# Patient Record
Sex: Male | Born: 1935 | Race: White | Hispanic: No | Smoking: Former smoker
Health system: Southern US, Community
[De-identification: ages and names within clinical notes are randomized; demographics above are authoritative.]

## PROBLEM LIST (undated history)

## (undated) DIAGNOSIS — E079 Disorder of thyroid, unspecified: Secondary | ICD-10-CM

## (undated) DIAGNOSIS — I714 Abdominal aortic aneurysm, without rupture, unspecified: Secondary | ICD-10-CM

## (undated) DIAGNOSIS — M1990 Unspecified osteoarthritis, unspecified site: Secondary | ICD-10-CM

## (undated) DIAGNOSIS — R600 Localized edema: Secondary | ICD-10-CM

## (undated) DIAGNOSIS — R161 Splenomegaly, not elsewhere classified: Secondary | ICD-10-CM

## (undated) DIAGNOSIS — R351 Nocturia: Secondary | ICD-10-CM

## (undated) DIAGNOSIS — D649 Anemia, unspecified: Secondary | ICD-10-CM

## (undated) DIAGNOSIS — Z87898 Personal history of other specified conditions: Secondary | ICD-10-CM

## (undated) DIAGNOSIS — R160 Hepatomegaly, not elsewhere classified: Secondary | ICD-10-CM

## (undated) DIAGNOSIS — I10 Essential (primary) hypertension: Secondary | ICD-10-CM

## (undated) DIAGNOSIS — T7840XA Allergy, unspecified, initial encounter: Secondary | ICD-10-CM

## (undated) HISTORY — DX: Localized edema: R60.0

## (undated) HISTORY — DX: Personal history of other specified conditions: Z87.898

## (undated) HISTORY — DX: Allergy, unspecified, initial encounter: T78.40XA

## (undated) HISTORY — DX: Abdominal aortic aneurysm, without rupture: I71.4

## (undated) HISTORY — DX: Splenomegaly, not elsewhere classified: R16.1

## (undated) HISTORY — DX: Disorder of thyroid, unspecified: E07.9

## (undated) HISTORY — DX: Abdominal aortic aneurysm, without rupture, unspecified: I71.40

## (undated) HISTORY — DX: Nocturia: R35.1

## (undated) HISTORY — DX: Anemia, unspecified: D64.9

## (undated) HISTORY — DX: Hepatomegaly, not elsewhere classified: R16.0

## (undated) HISTORY — DX: Unspecified osteoarthritis, unspecified site: M19.90

## (undated) HISTORY — PX: SHOULDER SURGERY: SHX246

---

## 1998-10-18 HISTORY — PX: HERNIA REPAIR: SHX51

## 2002-10-18 ENCOUNTER — Encounter: Payer: Self-pay | Admitting: *Deleted

## 2002-10-18 ENCOUNTER — Emergency Department (HOSPITAL_COMMUNITY): Admission: EM | Admit: 2002-10-18 | Discharge: 2002-10-18 | Payer: Self-pay | Admitting: *Deleted

## 2004-06-18 ENCOUNTER — Ambulatory Visit (HOSPITAL_COMMUNITY): Admission: RE | Admit: 2004-06-18 | Discharge: 2004-06-18 | Payer: Self-pay | Admitting: Family Medicine

## 2004-06-18 ENCOUNTER — Observation Stay (HOSPITAL_COMMUNITY): Admission: AD | Admit: 2004-06-18 | Discharge: 2004-06-19 | Payer: Self-pay | Admitting: Internal Medicine

## 2005-02-23 ENCOUNTER — Emergency Department (HOSPITAL_COMMUNITY): Admission: EM | Admit: 2005-02-23 | Discharge: 2005-02-23 | Payer: Self-pay | Admitting: Emergency Medicine

## 2005-10-30 ENCOUNTER — Emergency Department (HOSPITAL_COMMUNITY): Admission: EM | Admit: 2005-10-30 | Discharge: 2005-10-30 | Payer: Self-pay | Admitting: Emergency Medicine

## 2006-03-16 ENCOUNTER — Emergency Department (HOSPITAL_COMMUNITY): Admission: EM | Admit: 2006-03-16 | Discharge: 2006-03-16 | Payer: Self-pay | Admitting: Emergency Medicine

## 2008-06-05 ENCOUNTER — Ambulatory Visit (HOSPITAL_COMMUNITY): Admission: RE | Admit: 2008-06-05 | Discharge: 2008-06-05 | Payer: Self-pay | Admitting: Family Medicine

## 2008-09-06 ENCOUNTER — Encounter (HOSPITAL_COMMUNITY): Admission: RE | Admit: 2008-09-06 | Discharge: 2008-10-06 | Payer: Self-pay | Admitting: Family Medicine

## 2008-10-18 HISTORY — PX: FRACTURE SURGERY: SHX138

## 2008-11-30 ENCOUNTER — Inpatient Hospital Stay (HOSPITAL_COMMUNITY): Admission: EM | Admit: 2008-11-30 | Discharge: 2008-12-02 | Payer: Self-pay | Admitting: Emergency Medicine

## 2009-04-09 ENCOUNTER — Encounter: Admission: RE | Admit: 2009-04-09 | Discharge: 2009-07-08 | Payer: Self-pay | Admitting: Orthopedic Surgery

## 2011-02-02 LAB — CBC
Hemoglobin: 13.3 g/dL (ref 13.0–17.0)
Hemoglobin: 12.5 g/dL — ABNORMAL LOW (ref 13.0–17.0)
MCHC: 34 g/dL (ref 30.0–36.0)
MCHC: 34.3 g/dL (ref 30.0–36.0)
MCV: 90.7 fL (ref 78.0–100.0)
MCV: 92 fL (ref 78.0–100.0)
RBC: 4.32 MIL/uL (ref 4.22–5.81)
RDW: 14.1 % (ref 11.5–15.5)
WBC: 9.1 10*3/uL (ref 4.0–10.5)

## 2011-02-02 LAB — BASIC METABOLIC PANEL
BUN: 13 mg/dL (ref 6–23)
BUN: 9 mg/dL (ref 6–23)
CO2: 28 mEq/L (ref 19–32)
CO2: 29 mEq/L (ref 19–32)
Calcium: 9.4 mg/dL (ref 8.4–10.5)
Calcium: 8.5 mg/dL (ref 8.4–10.5)
Calcium: 8.8 mg/dL (ref 8.4–10.5)
Creatinine, Ser: 0.85 mg/dL (ref 0.4–1.5)
Creatinine, Ser: 0.89 mg/dL (ref 0.4–1.5)
GFR calc non Af Amer: >60 mL/min (ref >60)
GFR calc non Af Amer: >60 mL/min (ref >60)
Glucose, Bld: 126 mg/dL — ABNORMAL HIGH (ref 70–99)
Glucose, Bld: 119 mg/dL — ABNORMAL HIGH (ref 70–99)
Glucose, Bld: 137 mg/dL — ABNORMAL HIGH (ref 70–99)
Sodium: 142 mEq/L (ref 135–145)
Sodium: 142 mEq/L (ref 135–145)

## 2011-02-02 LAB — DIFFERENTIAL
Basophils Relative: 0 % (ref 0–1)
Eosinophils Absolute: 0.1 10*3/uL (ref 0.0–0.7)
Lymphs Abs: 1.1 10*3/uL (ref 0.7–4.0)
Monocytes Absolute: 0.6 10*3/uL (ref 0.1–1.0)
Monocytes Relative: 6 % (ref 3–12)
Neutro Abs: 7.3 10*3/uL (ref 1.7–7.7)
Neutrophils Relative %: 80 % — ABNORMAL HIGH (ref 43–77)

## 2011-02-02 LAB — GLUCOSE, CAPILLARY
Glucose-Capillary: 121 mg/dL — ABNORMAL HIGH (ref 70–99)
Glucose-Capillary: 131 mg/dL — ABNORMAL HIGH (ref 70–99)
Glucose-Capillary: 150 mg/dL — ABNORMAL HIGH (ref 70–99)
Glucose-Capillary: 184 mg/dL — ABNORMAL HIGH (ref 70–99)
Glucose-Capillary: 85 mg/dL (ref 70–99)

## 2011-02-02 LAB — SAMPLE TO BLOOD BANK

## 2011-02-02 LAB — PROTIME-INR: INR: 1 (ref 0.00–1.49)

## 2011-02-06 ENCOUNTER — Emergency Department (HOSPITAL_COMMUNITY)
Admission: EM | Admit: 2011-02-06 | Discharge: 2011-02-07 | Disposition: A | Discharge status: Home / Self Care | Payer: Medicare Other | Attending: Emergency Medicine | Admitting: Emergency Medicine

## 2011-02-06 DIAGNOSIS — Z7982 Long term (current) use of aspirin: Secondary | ICD-10-CM | POA: Insufficient documentation

## 2011-02-06 DIAGNOSIS — I10 Essential (primary) hypertension: Secondary | ICD-10-CM | POA: Insufficient documentation

## 2011-02-06 DIAGNOSIS — R040 Epistaxis: Secondary | ICD-10-CM | POA: Insufficient documentation

## 2011-02-06 DIAGNOSIS — Z86711 Personal history of pulmonary embolism: Secondary | ICD-10-CM | POA: Insufficient documentation

## 2011-02-06 DIAGNOSIS — Z79899 Other long term (current) drug therapy: Secondary | ICD-10-CM | POA: Insufficient documentation

## 2011-02-06 DIAGNOSIS — E785 Hyperlipidemia, unspecified: Secondary | ICD-10-CM | POA: Insufficient documentation

## 2011-03-02 NOTE — Op Note (Signed)
NAMEERRIC, Adrian Hester                 ACCOUNT NO.:  [id number]   MEDICAL RECORD NO.:  [id number]          PATIENT TYPE:  INP   LOCATION:  5016                         FACILITY:  [facility]   PHYSICIAN:  Mila Homer. Sherlean Foot, M.D. DATE OF BIRTH:  November 06, 1929   DATE OF PROCEDURE:  11/30/2008  DATE OF DISCHARGE:                               OPERATIVE REPORT   SURGEON:  Mila Homer. Sherlean Foot, M.D.   ASSISTANT:  Altamese Cabal, PA-C   ANESTHESIA:  General.   PREOPERATIVE DIAGNOSIS:  Left leg trimalleolar ankle fracture.   POST OPERATIVE DIAGNOSIS:  Left leg trimalleolar ankle fracture.   PROCEDURE:  Left ankle open reduction and internal fixation.   INDICATION FOR PROCEDURE:  The patient is a 77 year old white male after  falling this morning while on a walk, came to the emergency room.  Informed consent was obtained from the patient.   DESCRIPTION OF PROCEDURE:  The patient was laid supine, administered  general anesthesia.  Left leg was prepped and draped in usual sterile  fashion.  A lateral incision was made and direct lateral approach  performed down to the lateral malleolus.  The fracture was cleaned out  and then was reduced with bone tenaculum and reduction clamp.  I then  placed a lag screw in the standard fashion anterior to posterior.  I  then again checked it with C-arm to ensure it was reduced anatomically,  held very nicely after removing the clamp.  I then placed a  neutralization plate, 7 holes semitubular plate and placed 2 distal  cancellous screws, 3 proximal and distal interlocking screws.  Took a  picture to make sure all screws were appropriate and the plate was one  anatomically.  Then, I made a medial incision about 3-cm in length with  #10 blade in the medial malleolus, bluntly dissected down to the fascia,  now the fascia reduced to the two typical 9 mm K-wires.  Took C-arm  images to ensure the borders are anatomic.  Then, placed a 35-mm  prosthetic cancellous screw, 4 mm  in diameter anteriorly and 30 mm long  in the posterior one and took x-rays to ensure appropriate screw length  and anatomic reduction of the borders.  I then irrigated and closed with  0 and 2-0 Vicryl sutures, and skin staples.   COMPLICATIONS:  None.   DRAINS:  None.   TOURNIQUET TIME:  53 minutes.          ______________________________  Mila Homer Sherlean Foot, M.D.    SDL/MEDQ  D:  11/30/2008  T:  12/01/2008  Job:  [id number]

## 2011-03-05 NOTE — Discharge Summary (Signed)
NAMECASEY, Adrian Hester                 ACCOUNT NO.:  [id number]   MEDICAL RECORD NO.:  [id number]          PATIENT TYPE:  INP   LOCATION:  5016                         FACILITY:  [facility]   PHYSICIAN:  Mila Homer. Sherlean Foot, M.D. DATE OF BIRTH:  1930/07/12   DATE OF ADMISSION:  11/30/2008  DATE OF DISCHARGE:  12/02/2008                               DISCHARGE SUMMARY   ADMISSION DIAGNOSIS:  Left ankle fracture.   DISCHARGE DIAGNOSES:  1. Left ankle fracture, status post open reduction and internal      fixation of the left ankle.  2. Acute blood loss anemia, status post surgery.   PROCEDURE:  Open reduction and internal fixation of the left ankle.   HISTORY:  The patient is a 77 year old male who slipped on the ice the  morning of surgery, brought from [facility] via [facility] transfer.  No  other complaints.   PAST MEDICAL HISTORY:  Significant for increased cholesterol and  diabetes.  The patient's x-ray showed a left ankle fracture which was  intact with splint from [facility].   ALLERGIES:  No known drug allergies.   ADMISSION MEDICATIONS:  1. Aspirin 81 mg daily.  2. Metformin 500 mg twice a day.  3. Simcor 500/20 mg at bedtime.  4. Lotrel 10/20 mg daily.   HOSPITAL COURSE:  This is a 77 year old male admitted on November 30, 2008, after appropriate laboratory studies were obtained preoperatively  as well as Ancef on-call to the operating room, he was taken to the OR  where he underwent a left ankle ORIF.  The patient tolerated the  procedure well and was taken to the PACU in good condition.  The patient  was placed on p.o. pain medication.  No Foley was placed.   On postop day 1, the patient's pain was 1/10.  The patient's vital signs  were stable, within normal limits.  The patient was alert and oriented  x3.  Wound was clean and intact.  Compartment was soft and nontender.  Cardiovascular, regular rate and rhythm.  Lungs are clear to  auscultation bilaterally.  Abdomen was  soft, positive bowel sounds.  Lower extremity was intact.  The patient was out of bed with physical  therapy.  On postop day 2, the patient continued to progress with  physical therapy.  The patient was in splint, alert and oriented.  Vital  signs remained stable.  He had a negative Homans.  The rest of exam  remained stable as well.  The patient was continued on p.o. pain  medications and discharged to home after Lovenox teaching.   LABORATORY STUDIES:  Upon discharge from the hospital, the patient's  white blood cell count was 9.0, H and H was 12.5 and 36.5, and platelets  of 169.  Sodium was 139, potassium was 3.4, chloride was 105, CO2 of 28,  glucose of 137, BUN was 8, and creatinine was 0.85.   DISCHARGE INSTRUCTIONS:  There are no restrictions to diet.  The patient  is to follow his regular diabetic diet and continue taking his  medications except for aspirin which he  should discontinue until Lovenox  has finished.  Follow the blue instruction sheet for wound care.  Increase activity slowly.  May use a cane or walker.  Weightbearing as  tolerated.  No lifting or driving for 6 weeks.   DISCHARGE MEDICATIONS:  Upon discharge, the patient was given  prescriptions for;  1. Lovenox 40 mg inject 1 daily subcutaneously, last dose on December 12, 2008.  2. Vicodin 5/500 one to two tabs every 4-6 hours as needed for pain.  3. Celebrex 200 mg twice a day x30.  4. Also, KCl 10 mEq 1 twice a day for a week, #14.   The patient will follow up with Dr. Sherlean Foot on December 05, 2008, call for  appointment, [phone].  The patient is discharged in improved condition.     ______________________________  Adrian Cabal, PA-C    ______________________________  Mila Homer. Sherlean Foot, M.D.    MJ/MEDQ  D:  01/03/2009  T:  01/04/2009  Job:  [id number]

## 2012-03-13 ENCOUNTER — Emergency Department (HOSPITAL_COMMUNITY)
Admission: EM | Admit: 2012-03-13 | Discharge: 2012-03-13 | Disposition: A | Discharge status: Home / Self Care | Payer: Medicare Other | Attending: Emergency Medicine | Admitting: Emergency Medicine

## 2012-03-13 ENCOUNTER — Encounter (HOSPITAL_COMMUNITY): Payer: Self-pay | Admitting: Emergency Medicine

## 2012-03-13 DIAGNOSIS — R040 Epistaxis: Secondary | ICD-10-CM | POA: Insufficient documentation

## 2012-03-13 DIAGNOSIS — I10 Essential (primary) hypertension: Secondary | ICD-10-CM | POA: Insufficient documentation

## 2012-03-13 DIAGNOSIS — Z7982 Long term (current) use of aspirin: Secondary | ICD-10-CM | POA: Insufficient documentation

## 2012-03-13 HISTORY — DX: Essential (primary) hypertension: I10

## 2012-03-13 LAB — DIFFERENTIAL
Basophils Relative: 1 % (ref 0–1)
Eosinophils Absolute: 0.2 10*3/uL (ref 0.0–0.7)
Eosinophils Relative: 2 % (ref 0–5)
Monocytes Relative: 8 % (ref 3–12)
Neutrophils Relative %: 70 % (ref 43–77)

## 2012-03-13 LAB — CBC
Hemoglobin: 12.7 g/dL — ABNORMAL LOW (ref 13.0–17.0)
MCH: 29.4 pg (ref 26.0–34.0)
MCHC: 33 g/dL (ref 30.0–36.0)
MCV: 89.1 fL (ref 78.0–100.0)

## 2012-03-13 LAB — PROTIME-INR: INR: 0.99 (ref 0.00–1.49)

## 2012-03-13 MED ORDER — PHENYLEPHRINE HCL 0.5 % NA SOLN
1.0000 [drp] | Freq: Once | NASAL | Status: AC
  Administered 2012-03-13: 1 [drp] via NASAL
  Filled 2012-03-13: qty 15

## 2012-03-13 MED ORDER — SILVER NITRATE-POT NITRATE 75-25 % EX MISC
1.0000 "application " | Freq: Once | CUTANEOUS | Status: AC
  Administered 2012-03-13: 1 via TOPICAL
  Filled 2012-03-13: qty 1

## 2012-03-13 NOTE — ED Provider Notes (Signed)
History  This chart was scribed for Glynn Octave, MD by Sanjuana Letters Ajibulu. This patient was seen in room APA01/APA01 and the patient's care was started at 7:40AM.    CSN: [id number]  Arrival date & time 03/13/12  1478   First MD Initiated Contact with Patient 03/13/12 [phone]      Chief Complaint  Patient presents with  . Epistaxis    (Consider location/radiation/quality/duration/timing/severity/associated sxs/prior treatment) Patient is a 78 y.o. male presenting with nosebleeds. The history is provided by the patient. No language interpreter was used.  Epistaxis    Adrian Hester is a 78 y.o. male who presents to the Emergency Department complaining of gradual onset epistaxis. Pt reports that he has been having nose bleeds every week for the past month and has had 3 this month so far. Pt reports having a new episode of epistaxis today and described it as heavy. Pt states that his current episode lasted approximately 60 minutes. Pt reports that the bleeding comes from his right nostril only. Pt states that he pinched his nose to stop his current episode of epistaxis. Pt states that his PCP stated that  he has 3 blood vessels in his nose are weak and swollen and was told that he has to get them fused. Pt states that he currently takes Asprin but used to use coumadin. Pt denies taking any medication to relieve his current symptoms. Pt denies fever, eye pain, SOB, chest pain, vomiting, dysuria, back pain, rash, HA, adenopathy and agitation as associated symptoms. Pt has a h/o diabetes and HTN. Pt is a former smoker and denies a h/o alcohol use.  Past Medical History  Diagnosis Date  . Diabetes mellitus   . Hypertension     Past Surgical History  Procedure Date  . Hernia repair     No family history on file.  History  Substance Use Topics  . Smoking status: Former Games developer  . Smokeless tobacco: Not on file  . Alcohol Use: No      Review of Systems  HENT: Positive for nosebleeds.    Genitourinary: Negative for dysuria.  All other systems reviewed and are negative.    Allergies  Review of patient's allergies indicates no known allergies.  Home Medications  No current outpatient prescriptions on file.  Triage Vitals: BP 150/80  Pulse 83  Temp(Src) 98.2 F (36.8 C) (Oral)  Resp 18  Ht 5\' 11"  (1.803 m)  Wt 240 lb (108.863 kg)  BMI 33.47 kg/m2  SpO2 97%  Physical Exam  Nursing note and vitals reviewed. Constitutional: He is oriented to person, place, and time. He appears well-developed and well-nourished.  HENT:  Head: Normocephalic and atraumatic.       Pt has dried blood in the right nostril and has no active bleeding. Pt does not have blood in the oropharynx.  Eyes: Conjunctivae and EOM are normal.  Neck: Normal range of motion. Neck supple.  Cardiovascular: Normal rate, regular rhythm and normal heart sounds.   Pulmonary/Chest: Effort normal and breath sounds normal.  Abdominal: Soft. Bowel sounds are normal.  Musculoskeletal: Normal range of motion.  Neurological: He is alert and oriented to person, place, and time.  Skin: Skin is warm and dry.  Psychiatric: He has a normal mood and affect. His behavior is normal.    ED Course  EPISTAXIS MANAGEMENT Date/Time: 03/13/2012 8:35 AM Performed by: Glynn Octave Authorized by: Glynn Octave Consent: Verbal consent obtained. Risks and benefits: risks, benefits and alternatives were discussed Consent given  by: patient Patient understanding: patient states understanding of the procedure being performed Patient identity confirmed: verbally with patient Time out: Immediately prior to procedure a "time out" was called to verify the correct patient, procedure, equipment, support staff and site/side marked as required. Local anesthetic: topical anesthetic Treatment site: right anterior Repair method: silver nitrate Post-procedure assessment: bleeding stopped Treatment complexity: simple Patient  tolerance: Patient tolerated the procedure well with no immediate complications.   (including critical care time) DIAGNOSTIC STUDIES: Oxygen Saturation is 97% on room air, adequate  by my interpretation.    COORDINATION OF CARE:  7:47 AM: Discussed administering a nose spray to shrink the blood vessels down. Also discussed cauterizating  the blood vessels in his nose. I advised the pt that cauterization is a short term fix and that he still needs to see nose doctor.  Labs Reviewed  CBC - Abnormal; Notable for the following:    Hemoglobin 12.7 (*)    HCT 38.5 (*)    Platelets 139 (*)    All other components within normal limits  DIFFERENTIAL  PROTIME-INR   No results found.   No diagnosis found.    MDM  Intermittent epistaxis from the right naris over the past month. One hour bleeding today the patient was able to control at home. Takes aspirin but not Coumadin.  Friable area to right nasal septum was cauterized  Hemoglobin stable. INR normal. Observed for one hour in the ED without additional bleeding.  Referral to [facility].  I personally performed the services described in this documentation, which was scribed in my presence.  The recorded information has been reviewed and considered.    Glynn Octave, MD 03/13/12 [phone]

## 2012-03-13 NOTE — Discharge Instructions (Signed)

## 2012-03-13 NOTE — ED Notes (Signed)
Patient with c/o epistaxis intermittently x 1 month. Patient reports new episode today with what he describes as heavy and lasting approximately 60 minutes. Bleeding controlled at present. Patient reports taking ASA. Has seen primary care MD for this problem.

## 2012-06-18 DIAGNOSIS — R161 Splenomegaly, not elsewhere classified: Secondary | ICD-10-CM

## 2012-06-18 DIAGNOSIS — R160 Hepatomegaly, not elsewhere classified: Secondary | ICD-10-CM

## 2012-06-18 HISTORY — DX: Hepatomegaly, not elsewhere classified: R16.0

## 2012-06-18 HISTORY — DX: Splenomegaly, not elsewhere classified: R16.1

## 2012-07-11 ENCOUNTER — Other Ambulatory Visit (HOSPITAL_COMMUNITY): Payer: Self-pay | Admitting: Family Medicine

## 2012-07-14 ENCOUNTER — Ambulatory Visit (HOSPITAL_COMMUNITY)
Admission: RE | Admit: 2012-07-14 | Discharge: 2012-07-14 | Disposition: A | Discharge status: Home / Self Care | Payer: Medicare Other | Source: Ambulatory Visit | Attending: Family Medicine | Admitting: Family Medicine

## 2012-07-14 ENCOUNTER — Other Ambulatory Visit (HOSPITAL_COMMUNITY): Payer: Self-pay | Admitting: Family Medicine

## 2012-07-14 DIAGNOSIS — Q619 Cystic kidney disease, unspecified: Secondary | ICD-10-CM | POA: Insufficient documentation

## 2012-07-14 DIAGNOSIS — K7689 Other specified diseases of liver: Secondary | ICD-10-CM | POA: Insufficient documentation

## 2012-07-14 DIAGNOSIS — R161 Splenomegaly, not elsewhere classified: Secondary | ICD-10-CM | POA: Insufficient documentation

## 2012-07-14 DIAGNOSIS — I714 Abdominal aortic aneurysm, without rupture, unspecified: Secondary | ICD-10-CM | POA: Insufficient documentation

## 2012-07-14 LAB — POCT I-STAT, CHEM 8
BUN: 15 mg/dL (ref 6–23)
Calcium, Ion: 1.09 mmol/L — ABNORMAL LOW (ref 1.13–1.30)
Chloride: 111 mEq/L (ref 96–112)
Creatinine, Ser: 1 mg/dL (ref 0.50–1.35)
Glucose, Bld: 114 mg/dL — ABNORMAL HIGH (ref 70–99)
TCO2: 20 mmol/L (ref 0–100)

## 2012-07-14 MED ORDER — GADOBENATE DIMEGLUMINE 529 MG/ML IV SOLN
20.0000 mL | Freq: Once | INTRAVENOUS | Status: AC | PRN
  Administered 2012-07-14: 20 mL via INTRAVENOUS

## 2012-07-14 NOTE — Progress Notes (Signed)
Venipuncture performed in left antecubital for Creatnine level

## 2012-12-20 ENCOUNTER — Encounter: Payer: Self-pay | Admitting: Vascular Surgery

## 2012-12-25 ENCOUNTER — Encounter: Payer: Self-pay | Admitting: Vascular Surgery

## 2013-01-01 ENCOUNTER — Encounter: Payer: Self-pay | Admitting: Vascular Surgery

## 2013-01-02 ENCOUNTER — Ambulatory Visit (INDEPENDENT_AMBULATORY_CARE_PROVIDER_SITE_OTHER): Payer: Medicare Other | Admitting: Vascular Surgery

## 2013-01-02 ENCOUNTER — Encounter: Payer: Self-pay | Admitting: Vascular Surgery

## 2013-01-02 VITALS — BP 164/76 | HR 73 | Ht 71.0 in | Wt 235.0 lb

## 2013-01-02 DIAGNOSIS — I714 Abdominal aortic aneurysm, without rupture, unspecified: Secondary | ICD-10-CM

## 2013-01-02 NOTE — Progress Notes (Signed)
Subjective:     Patient ID: Adrian Hester, male   DOB: 07/18/1930, 79 y.o.   MRN: [id number]  HPIthis 79 year old male was referred by Dr. Lynelle Smoke for evaluation of 5.1 cm abdominal aortic aneurysm. Patient had a previous MRI which revealed the aorta to be 4.4 cm in recent [facility] by [facility]aled the aneurysm to have grown to 5.1 cm in maximum diameter. Patient denies any abdominal symptoms but does have chronic back pain. He does have a remote history of pulmonary embolus and was treated with anticoagulants for 6 months but this was many years ago and he is only taking aspirin at this time.  Past Medical History  Diagnosis Date  . Hypertension   . Thyroid disease     Hypothyroidism, Chronic  . Arthritis     Osteoarthritis  . Hx of epistaxis   . Nocturia   . Hepatomegaly Sept. 2013    Fatty Liver  . Splenomegaly Sept. 2013    Benign splenic hemangioma  . AAA (abdominal aortic aneurysm) Sept. 2013  and  Feb. 2014    Infrarenal   . Edema, lower extremity     Left ankle trauma  . Diabetes mellitus     Type II  . Allergy     Rhinitis, Hayfever  . Nocturia     History  Substance Use Topics  . Smoking status: Former Smoker    Quit date: 06/03/2011  . Smokeless tobacco: Never Used  . Alcohol Use: No    Family History  Problem Relation Age of Onset  . Cancer Mother     Colon  . Stroke Mother   . Cancer Father   . Diabetes Father   . Cancer Brother     Prostate  . Heart disease Brother   . AAA (abdominal aortic aneurysm) Brother   . Heart attack Brother   . Cancer Brother     Prostate  . Heart disease Sister   . Heart attack Sister   . Hyperlipidemia Daughter   . Hypertension Daughter   . Hyperlipidemia Son   . Hypertension Son     No Known Allergies  Current outpatient prescriptions:amLODipine (NORVASC) 10 MG tablet, Take 10 mg by mouth daily., Disp: , Rfl: ;  aspirin 81 MG tablet, Take 81 mg by mouth daily., Disp: , Rfl: ;  benazepril  (LOTENSIN) 40 MG tablet, Take 40 mg by mouth daily., Disp: , Rfl: ;  fish oil-omega-3 fatty acids 1000 MG capsule, Take 2 g by mouth 2 (two) times daily., Disp: , Rfl:  levothyroxine (SYNTHROID, LEVOTHROID) 50 MCG tablet, Take 50 mcg by mouth daily., Disp: , Rfl: ;  loratadine (CLARITIN) 10 MG tablet, Take 10 mg by mouth 2 (two) times daily. , Disp: , Rfl: ;  metFORMIN (GLUCOPHAGE) 500 MG tablet, Take 500 mg by mouth 2 (two) times daily with a meal., Disp: , Rfl: ;  clotrimazole-betamethasone (LOTRISONE) cream, Apply topically 2 (two) times daily., Disp: , Rfl:  fluticasone (FLONASE) 50 MCG/ACT nasal spray, Place 2 sprays into the nose daily., Disp: , Rfl:   BP 164/76  Pulse 73  Ht 5\' 11"  (1.803 m)  Wt 235 lb (106.595 kg)  BMI 32.79 kg/m2  SpO2 96%  Body mass index is 32.79 kg/(m^2).           Review of Systems  Denies chest pain but does have mild dyspnea on exertion.suspected history of DVT although not documented with chronic swelling left leg and varicose veins. Remote history  of PE as noted above. Denies claudication. No hemoptysis or PND or orthopnea.      Objective:   Physical Exam blood pressure 164/76 heart rate 73 respirations 18 Gen.-alert and oriented x3 in no apparent distress HEENT normal for age Lungs no rhonchi or wheezing Cardiovascular regular rhythm no murmurs carotid pulses 3+ palpable no bruits audible Abdomen soft nontender no palpable masses-Obese Musculoskeletal free of  major deformities Skin clear -no rashes Neurologic normal Lower extremities 3+ femoral and 2+ popliteal pulses palpable bilaterally. No DP or PT pulses palpable. The feet well perfused. Left leg with 1+ edema compared to right.  Today I reviewed the clinical records provided by Dr. Leandrew Koyanagi in the reports regarding the aneurysm of 5.1 cm in maximum diameter Patient has normal renal function        Assessment:     #15.1 cm abdominal aortic aneurysm #2 remote history of pulmonary  embolism not on anticoagulants for many years #3 type 2 diabetes mellitus     Plan:     #1 Cardiolite-nuclear medicine scan scheduled #2 CT angiogram of abdomen and pelvis #3 return 2-3 weeks to discuss options

## 2013-01-02 NOTE — Addendum Note (Signed)
Addended by: Dannielle Karvonen on: 01/02/2013 03:18 PM   Modules accepted: Orders

## 2013-01-09 ENCOUNTER — Ambulatory Visit (HOSPITAL_COMMUNITY): Payer: Medicare Other | Attending: Vascular Surgery | Admitting: Radiology

## 2013-01-09 ENCOUNTER — Other Ambulatory Visit: Payer: Self-pay | Admitting: Vascular Surgery

## 2013-01-09 VITALS — BP 127/83 | HR 63 | Ht 71.0 in | Wt 231.0 lb

## 2013-01-09 DIAGNOSIS — I714 Abdominal aortic aneurysm, without rupture: Secondary | ICD-10-CM

## 2013-01-09 DIAGNOSIS — I719 Aortic aneurysm of unspecified site, without rupture: Secondary | ICD-10-CM

## 2013-01-09 DIAGNOSIS — R0789 Other chest pain: Secondary | ICD-10-CM | POA: Insufficient documentation

## 2013-01-09 DIAGNOSIS — Z01810 Encounter for preprocedural cardiovascular examination: Secondary | ICD-10-CM

## 2013-01-09 DIAGNOSIS — R0609 Other forms of dyspnea: Secondary | ICD-10-CM | POA: Insufficient documentation

## 2013-01-09 DIAGNOSIS — E119 Type 2 diabetes mellitus without complications: Secondary | ICD-10-CM | POA: Insufficient documentation

## 2013-01-09 DIAGNOSIS — I10 Essential (primary) hypertension: Secondary | ICD-10-CM | POA: Insufficient documentation

## 2013-01-09 DIAGNOSIS — Z87891 Personal history of nicotine dependence: Secondary | ICD-10-CM | POA: Insufficient documentation

## 2013-01-09 DIAGNOSIS — R0989 Other specified symptoms and signs involving the circulatory and respiratory systems: Secondary | ICD-10-CM | POA: Insufficient documentation

## 2013-01-09 DIAGNOSIS — E669 Obesity, unspecified: Secondary | ICD-10-CM | POA: Insufficient documentation

## 2013-01-09 DIAGNOSIS — Z8249 Family history of ischemic heart disease and other diseases of the circulatory system: Secondary | ICD-10-CM | POA: Insufficient documentation

## 2013-01-09 DIAGNOSIS — R079 Chest pain, unspecified: Secondary | ICD-10-CM

## 2013-01-09 LAB — BUN: BUN: 13 mg/dL (ref 6–23)

## 2013-01-09 MED ORDER — TECHNETIUM TC 99M SESTAMIBI GENERIC - CARDIOLITE
11.0000 | Freq: Once | INTRAVENOUS | Status: AC | PRN
  Administered 2013-01-09: 11 via INTRAVENOUS

## 2013-01-09 MED ORDER — TECHNETIUM TC 99M SESTAMIBI GENERIC - CARDIOLITE
33.0000 | Freq: Once | INTRAVENOUS | Status: AC | PRN
  Administered 2013-01-09: 33 via INTRAVENOUS

## 2013-01-09 MED ORDER — REGADENOSON 0.4 MG/5ML IV SOLN
0.4000 mg | Freq: Once | INTRAVENOUS | Status: AC
  Administered 2013-01-09: 0.4 mg via INTRAVENOUS

## 2013-01-09 NOTE — Progress Notes (Signed)
[name] 3 NUCLEAR MED [address] [place], [place] [address] [phone]    Cardiology Nuclear Med Study  Adrian Hester is a 79 y.o. male     MRN : [id number]     DOB: 19-May-1930  Procedure Date: 01/09/2013  Nuclear Med Background Indication for Stress Test:  Evaluation for Ischemia and Pending Surgical Clearance for AAA Repair with Dr. Josephina Gip History:  ~10 yrs ago  [name]:[name] per pt. Cardiac Risk Factors: Family History - CAD, History of Smoking, Hypertension, NIDDM and Obesity  Symptoms:  Chest Pain/"Gas" (last episode of chest discomfort was yesterday) and DOE   Nuclear Pre-Procedure Caffeine/Decaff Intake:  8:00pm NPO After: 8:00pm   Lungs:  Clear. O2 Sat: 95% on room air. IV 0.9% NS with Angio Cath:  20g  IV Site: R Hand  IV Started by:  Cathlyn Parsons, RN  Chest Size (in):  48 Cup Size: n/a  Height: 5\' 11"  (1.803 m)  Weight:  231 lb (104.781 kg)  BMI:  Body mass index is 32.23 kg/(m^2). Tech Comments:  n/a    Nuclear Med Study 1 or 2 day study: 1 day  Stress Test Type:  Lexiscan  Reading MD: Kristeen Miss, MD  Order Authorizing Provider:  Josephina Gip, MD  Resting Radionuclide: Technetium 71m Sestamibi  Resting Radionuclide Dose: 11.0 mCi   Stress Radionuclide:  Technetium 45m Sestamibi  Stress Radionuclide Dose: 33.0 mCi           Stress Protocol Rest HR: 63 Stress HR: 83  Rest BP: 127/83 Stress BP: 128/83  Exercise Time (min): n/a METS: n/a   Predicted Max HR: 140 bpm % Max HR: 59.29 bpm Rate Pressure Product: 46962   Dose of Adenosine (mg):  n/a Dose of Lexiscan: 0.4 mg  Dose of Atropine (mg): n/a Dose of Dobutamine: n/a mcg/kg/min (at max HR)  Stress Test Technologist: Smiley Houseman, CMA-N  Nuclear Technologist:  Domenic Polite, CNMT     Rest Procedure:  Myocardial perfusion imaging was performed at rest 45 minutes following the intravenous administration of Technetium 56m Sestamibi.  Rest ECG: NSR - Normal  EKG  Stress Procedure:  The patient received IV Lexiscan 0.4 mg over 15-seconds.  He did c/o right-sided pressure with Lexiscan.  Technetium 58m Sestamibi injected at 30-seconds.  Quantitative spect images were obtained after a 45 minute delay.  Stress ECG: No significant change from baseline ECG  QPS Raw Data Images:  Mild diaphragmatic attenuation.  Normal left ventricular size. Stress Images:  There is a small area of mildly decreased uptake in the inferior base.  The uptake in the other areas is normal.      Rest Images:  There is a small area of mildly decreased uptake in the inferior base.  The uptake in the other areas is normal. Subtraction (SDS):  No evidence of ischemia. Transient Ischemic Dilatation (Normal <1.22):  1.16 Lung/Heart Ratio (Normal <0.45):  0.29  Quantitative Gated Spect Images QGS EDV:  143 ml QGS ESV:  68 ml  Impression Exercise Capacity:  Lexiscan with no exercise. BP Response:  Normal blood pressure response. Clinical Symptoms:  No significant symptoms noted. ECG Impression:  No significant ST segment change suggestive of ischemia. Comparison with Prior Nuclear Study: No previous nuclear study performed  Overall Impression:  Low risk stress nuclear study.   There is a small fixed defect in the inferior basal wall.  This is likely due to diaphragmatic attenuation.   I cannot rule out a  small area of  inferior basal ischemia.     LV Ejection Fraction: 52%.  LV Wall Motion:  NL LV Function; NL Wall Motion.   Vesta Mixer, Montez Hageman., MD, [facility] 01/09/2013, 5:48 PM Office - [phone] Pager [phone]

## 2013-01-15 ENCOUNTER — Encounter: Payer: Self-pay | Admitting: Vascular Surgery

## 2013-01-16 ENCOUNTER — Ambulatory Visit: Payer: Medicare Other | Admitting: Vascular Surgery

## 2013-01-16 ENCOUNTER — Encounter: Payer: Self-pay | Admitting: Vascular Surgery

## 2013-01-16 ENCOUNTER — Ambulatory Visit (INDEPENDENT_AMBULATORY_CARE_PROVIDER_SITE_OTHER): Payer: Medicare Other | Admitting: Vascular Surgery

## 2013-01-16 ENCOUNTER — Ambulatory Visit
Admission: RE | Admit: 2013-01-16 | Discharge: 2013-01-16 | Disposition: A | Discharge status: Home / Self Care | Payer: Medicare Other | Source: Ambulatory Visit | Attending: Vascular Surgery | Admitting: Vascular Surgery

## 2013-01-16 VITALS — BP 135/83 | HR 72 | Resp 16 | Ht 72.0 in | Wt 232.0 lb

## 2013-01-16 DIAGNOSIS — I714 Abdominal aortic aneurysm, without rupture: Secondary | ICD-10-CM

## 2013-01-16 MED ORDER — IOHEXOL 350 MG/ML SOLN
80.0000 mL | Freq: Once | INTRAVENOUS | Status: AC | PRN
  Administered 2013-01-16: 80 mL via INTRAVENOUS

## 2013-01-16 NOTE — Addendum Note (Signed)
Addended by: Dannielle Karvonen on: 01/16/2013 04:25 PM   Modules accepted: Orders

## 2013-01-16 NOTE — Progress Notes (Signed)
Subjective:     Patient ID: Adrian Hester, male   DOB: 1929/11/18, 79 y.o.   MRN: [id number]  HPI this 79 year old male returns for further discussion regarding his abdominal aortic aneurysm. He had an ultrasound performed by [facility] which revealed a 5.1 cm abdominal aortic aneurysm. I ordered a CT angiogram of the abdomen and pelvis which I reviewed today by computer. Infrarenal aortic aneurysm has a maximum diameter between 4.4 and 4.5 cm. He has had no history of abdominal or back symptoms.  Past Medical History  Diagnosis Date  . Hypertension   . Thyroid disease     Hypothyroidism, Chronic  . Arthritis     Osteoarthritis  . Hx of epistaxis   . Nocturia   . Hepatomegaly Sept. 2013    Fatty Liver  . Splenomegaly Sept. 2013    Benign splenic hemangioma  . AAA (abdominal aortic aneurysm) Sept. 2013  and  Feb. 2014    Infrarenal   . Edema, lower extremity     Left ankle trauma  . Diabetes mellitus     Type II  . Allergy     Rhinitis, Hayfever  . Nocturia     History  Substance Use Topics  . Smoking status: Former Smoker    Quit date: 06/03/2011  . Smokeless tobacco: Never Used  . Alcohol Use: No    Family History  Problem Relation Age of Onset  . Cancer Mother     Colon  . Stroke Mother   . Cancer Father   . Diabetes Father   . Cancer Brother     Prostate  . Heart disease Brother   . AAA (abdominal aortic aneurysm) Brother   . Heart attack Brother   . Cancer Brother     Prostate  . Heart disease Sister   . Heart attack Sister   . Hyperlipidemia Daughter   . Hypertension Daughter   . Hyperlipidemia Son   . Hypertension Son     No Known Allergies  Current outpatient prescriptions:amLODipine (NORVASC) 10 MG tablet, Take 10 mg by mouth daily., Disp: , Rfl: ;  aspirin 81 MG tablet, Take 81 mg by mouth daily., Disp: , Rfl: ;  benazepril (LOTENSIN) 40 MG tablet, Take 40 mg by mouth daily., Disp: , Rfl: ;  clotrimazole-betamethasone (LOTRISONE) cream, Apply  topically 2 (two) times daily., Disp: , Rfl:  fish oil-omega-3 fatty acids 1000 MG capsule, Take 2 g by mouth 2 (two) times daily., Disp: , Rfl: ;  fluticasone (FLONASE) 50 MCG/ACT nasal spray, Place 2 sprays into the nose daily., Disp: , Rfl: ;  levothyroxine (SYNTHROID, LEVOTHROID) 50 MCG tablet, Take 50 mcg by mouth daily., Disp: , Rfl: ;  loratadine (CLARITIN) 10 MG tablet, Take 10 mg by mouth 2 (two) times daily. , Disp: , Rfl:  metFORMIN (GLUCOPHAGE) 500 MG tablet, Take 500 mg by mouth 2 (two) times daily with a meal., Disp: , Rfl:   BP 135/83  Pulse 72  Resp 16  Ht 6' (1.829 m)  Wt 232 lb (105.235 kg)  BMI 31.46 kg/m2  SpO2 94%  Body mass index is 31.46 kg/(m^2).           Review of Systems     Objective:   Physical Exam BP 135/83  Pulse 72  Resp 16  Ht 6' (1.829 m)  Wt 232 lb (105.235 kg)  BMI 31.46 kg/m2  SpO2 94%  Gen.-obese male patient in no apparent stress alert and oriented x3 Lungs no  rhonchi or wheezing Abdomen obese no palpable pulsatile mass  I reviewed the CT angiogram of the abdomen and pelvis by computer today and compared it to the radiologist's interpretation     Assessment:     4.4 cm infrarenal, aortic aneurysm    Plan:     Patient return in 9 months with a duplex ultrasound exam in our office  Patient was told if he develops any severe abdominal or flank pain he should report immediately to the emergency department

## 2013-10-22 ENCOUNTER — Encounter: Payer: Self-pay | Admitting: Family

## 2013-10-23 ENCOUNTER — Ambulatory Visit (INDEPENDENT_AMBULATORY_CARE_PROVIDER_SITE_OTHER): Payer: Medicare HMO | Admitting: Family

## 2013-10-23 ENCOUNTER — Ambulatory Visit (HOSPITAL_COMMUNITY)
Admission: RE | Admit: 2013-10-23 | Discharge: 2013-10-23 | Disposition: A | Discharge status: Home / Self Care | Payer: Medicare HMO | Source: Ambulatory Visit | Attending: Vascular Surgery | Admitting: Vascular Surgery

## 2013-10-23 ENCOUNTER — Encounter: Payer: Self-pay | Admitting: Family

## 2013-10-23 VITALS — BP 155/79 | HR 66 | Resp 16 | Ht 72.0 in | Wt 235.0 lb

## 2013-10-23 DIAGNOSIS — I714 Abdominal aortic aneurysm, without rupture, unspecified: Secondary | ICD-10-CM
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
## 2013-10-23 NOTE — Patient Instructions (Signed)

## 2014-04-23 ENCOUNTER — Ambulatory Visit: Payer: Medicare HMO | Admitting: Family

## 2014-04-23 ENCOUNTER — Other Ambulatory Visit (HOSPITAL_COMMUNITY): Payer: Medicare HMO

## 2014-04-29 ENCOUNTER — Encounter: Payer: Self-pay | Admitting: Family

## 2014-04-30 ENCOUNTER — Ambulatory Visit (HOSPITAL_COMMUNITY)
Admission: RE | Admit: 2014-04-30 | Discharge: 2014-04-30 | Disposition: A | Discharge status: Home / Self Care | Payer: Medicare HMO | Source: Ambulatory Visit | Attending: Family | Admitting: Family

## 2014-04-30 ENCOUNTER — Encounter: Payer: Self-pay | Admitting: Family

## 2014-04-30 ENCOUNTER — Ambulatory Visit (INDEPENDENT_AMBULATORY_CARE_PROVIDER_SITE_OTHER): Payer: Commercial Managed Care - HMO | Admitting: Family

## 2014-04-30 VITALS — BP 138/83 | HR 75 | Resp 16 | Ht 71.5 in | Wt 232.0 lb

## 2014-04-30 DIAGNOSIS — I714 Abdominal aortic aneurysm, without rupture, unspecified: Secondary | ICD-10-CM | POA: Insufficient documentation

## 2014-04-30 DIAGNOSIS — I739 Peripheral vascular disease, unspecified: Secondary | ICD-10-CM

## 2014-04-30 NOTE — Addendum Note (Signed)
Addended by: Sharee PimpleMCCHESNEY, Edin Kon K on: 04/30/2014 10:40 AM   Modules accepted: Orders

## 2014-04-30 NOTE — Patient Instructions (Signed)
Abdominal Aortic Aneurysm An aneurysm is a weakened or damaged part of an artery wall that bulges from the normal force of blood pumping through the body. An abdominal aortic aneurysm is an aneurysm that occurs in the lower part of the aorta, the main artery of the body.  The major concern with an abdominal aortic aneurysm is that it can enlarge and burst (rupture) or blood can flow between the layers of the wall of the aorta through a tear (aorticdissection). Both of these conditions can cause bleeding inside the body and can be life threatening unless diagnosed and treated promptly. CAUSES  The exact cause of an abdominal aortic aneurysm is unknown. Some contributing factors are:   A hardening of the arteries caused by the buildup of fat and other substances in the lining of a blood vessel (arteriosclerosis).  Inflammation of the walls of an artery (arteritis).   Connective tissue diseases, such as Marfan syndrome.   Abdominal trauma.   An infection, such as syphilis or staphylococcus, in the wall of the aorta (infectious aortitis) caused by bacteria. RISK FACTORS  Risk factors that contribute to an abdominal aortic aneurysm may include:  Age older than 60 years.   High blood pressure (hypertension).  Male gender.  Ethnicity (white race).  Obesity.  Family history of aneurysm (first degree relatives only).  Tobacco use. PREVENTION  The following healthy lifestyle habits may help decrease your risk of abdominal aortic aneurysm:  Quitting smoking. Smoking can raise your blood pressure and cause arteriosclerosis.  Limiting or avoiding alcohol.  Keeping your blood pressure, blood sugar level, and cholesterol levels within normal limits.  Decreasing your salt intake. In somepeople, too much salt can raise blood pressure and increase your risk of abdominal aortic aneurysm.  Eating a diet low in saturated fats and cholesterol.  Increasing your fiber intake by including  whole grains, vegetables, and fruits in your diet. Eating these foods may help lower blood pressure.  Maintaining a healthy weight.  Staying physically active and exercising regularly. SYMPTOMS  The symptoms of abdominal aortic aneurysm may vary depending on the size and rate of growth of the aneurysm.Most grow slowly and do not have any symptoms. When symptoms do occur, they may include:  Pain (abdomen, side, lower back, or groin). The pain may vary in intensity. A sudden onset of severe pain may indicate that the aneurysm has ruptured.  Feeling full after eating only small amounts of food.  Nausea or vomiting or both.  Feeling a pulsating lump in the abdomen.  Feeling faint or passing out. DIAGNOSIS  Since most unruptured abdominal aortic aneurysms have no symptoms, they are often discovered during diagnostic exams for other conditions. An aneurysm may be found during the following procedures:  Ultrasonography (A one-time screening for abdominal aortic aneurysm by ultrasonography is also recommended for all men aged 65-75 years who have ever smoked).  X-ray exams.  A computed tomography (CT).  Magnetic resonance imaging (MRI).  Angiography or arteriography. TREATMENT  Treatment of an abdominal aortic aneurysm depends on the size of your aneurysm, your age, and risk factors for rupture. Medication to control blood pressure and pain may be used to manage aneurysms smaller than 6 cm. Regular monitoring for enlargement may be recommended by your caregiver if:  The aneurysm is 3-4 cm in size (an annual ultrasonography may be recommended).  The aneurysm is 4-4.5 cm in size (an ultrasonography every 6 months may be recommended).  The aneurysm is larger than 4.5 cm in   size (your caregiver may ask that you be examined by a vascular surgeon). If your aneurysm is larger than 6 cm, surgical repair may be recommended. There are two main methods for repair of an aneurysm:   Endovascular  repair (a minimally invasive surgery). This is done most often.  Open repair. This method is used if an endovascular repair is not possible. Document Released: 07/14/2005 Document Revised: 01/29/2013 Document Reviewed: 11/03/2012 Exit[employer] Patient Information 2015 [employer]. This information is not intended to replace advice given to you by your health care provider. Make sure you discuss any questions you have with your health care provider.  
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
## 2014-10-28 ENCOUNTER — Encounter: Payer: Self-pay | Admitting: Family

## 2014-10-29 ENCOUNTER — Ambulatory Visit (INDEPENDENT_AMBULATORY_CARE_PROVIDER_SITE_OTHER): Payer: Commercial Managed Care - HMO | Admitting: Family

## 2014-10-29 ENCOUNTER — Ambulatory Visit (INDEPENDENT_AMBULATORY_CARE_PROVIDER_SITE_OTHER)
Admission: RE | Admit: 2014-10-29 | Discharge: 2014-10-29 | Disposition: A | Discharge status: Home / Self Care | Payer: Commercial Managed Care - HMO | Source: Ambulatory Visit | Attending: Family | Admitting: Family

## 2014-10-29 ENCOUNTER — Ambulatory Visit (HOSPITAL_COMMUNITY)
Admission: RE | Admit: 2014-10-29 | Discharge: 2014-10-29 | Disposition: A | Discharge status: Home / Self Care | Payer: Commercial Managed Care - HMO | Source: Ambulatory Visit | Attending: Family | Admitting: Family

## 2014-10-29 ENCOUNTER — Encounter: Payer: Self-pay | Admitting: Family

## 2014-10-29 VITALS — BP 136/82 | HR 86 | Resp 16 | Ht 72.0 in | Wt 234.0 lb

## 2014-10-29 DIAGNOSIS — I714 Abdominal aortic aneurysm, without rupture, unspecified: Secondary | ICD-10-CM

## 2014-10-29 DIAGNOSIS — R0989 Other specified symptoms and signs involving the circulatory and respiratory systems: Secondary | ICD-10-CM | POA: Diagnosis not present

## 2014-10-29 DIAGNOSIS — I739 Peripheral vascular disease, unspecified: Secondary | ICD-10-CM | POA: Diagnosis not present

## 2014-10-29 NOTE — Patient Instructions (Signed)
Abdominal Aortic Aneurysm An aneurysm is a weakened or damaged part of an artery wall that bulges from the normal force of blood pumping through the body. An abdominal aortic aneurysm is an aneurysm that occurs in the lower part of the aorta, the main artery of the body.  The major concern with an abdominal aortic aneurysm is that it can enlarge and burst (rupture) or blood can flow between the layers of the wall of the aorta through a tear (aorticdissection). Both of these conditions can cause bleeding inside the body and can be life threatening unless diagnosed and treated promptly. CAUSES  The exact cause of an abdominal aortic aneurysm is unknown. Some contributing factors are:   A hardening of the arteries caused by the buildup of fat and other substances in the lining of a blood vessel (arteriosclerosis).  Inflammation of the walls of an artery (arteritis).   Connective tissue diseases, such as Marfan syndrome.   Abdominal trauma.   An infection, such as syphilis or staphylococcus, in the wall of the aorta (infectious aortitis) caused by bacteria. RISK FACTORS  Risk factors that contribute to an abdominal aortic aneurysm may include:  Age older than 60 years.   High blood pressure (hypertension).  Male gender.  Ethnicity (white race).  Obesity.  Family history of aneurysm (first degree relatives only).  Tobacco use. PREVENTION  The following healthy lifestyle habits may help decrease your risk of abdominal aortic aneurysm:  Quitting smoking. Smoking can raise your blood pressure and cause arteriosclerosis.  Limiting or avoiding alcohol.  Keeping your blood pressure, blood sugar level, and cholesterol levels within normal limits.  Decreasing your salt intake. In somepeople, too much salt can raise blood pressure and increase your risk of abdominal aortic aneurysm.  Eating a diet low in saturated fats and cholesterol.  Increasing your fiber intake by including  whole grains, vegetables, and fruits in your diet. Eating these foods may help lower blood pressure.  Maintaining a healthy weight.  Staying physically active and exercising regularly. SYMPTOMS  The symptoms of abdominal aortic aneurysm may vary depending on the size and rate of growth of the aneurysm.Most grow slowly and do not have any symptoms. When symptoms do occur, they may include:  Pain (abdomen, side, lower back, or groin). The pain may vary in intensity. A sudden onset of severe pain may indicate that the aneurysm has ruptured.  Feeling full after eating only small amounts of food.  Nausea or vomiting or both.  Feeling a pulsating lump in the abdomen.  Feeling faint or passing out. DIAGNOSIS  Since most unruptured abdominal aortic aneurysms have no symptoms, they are often discovered during diagnostic exams for other conditions. An aneurysm may be found during the following procedures:  Ultrasonography (A one-time screening for abdominal aortic aneurysm by ultrasonography is also recommended for all men aged 65-75 years who have ever smoked).  X-ray exams.  A computed tomography (CT).  Magnetic resonance imaging (MRI).  Angiography or arteriography. TREATMENT  Treatment of an abdominal aortic aneurysm depends on the size of your aneurysm, your age, and risk factors for rupture. Medication to control blood pressure and pain may be used to manage aneurysms smaller than 6 cm. Regular monitoring for enlargement may be recommended by your caregiver if:  The aneurysm is 3-4 cm in size (an annual ultrasonography may be recommended).  The aneurysm is 4-4.5 cm in size (an ultrasonography every 6 months may be recommended).  The aneurysm is larger than 4.5 cm in   size (your caregiver may ask that you be examined by a vascular surgeon). If your aneurysm is larger than 6 cm, surgical repair may be recommended. There are two main methods for repair of an aneurysm:   Endovascular  repair (a minimally invasive surgery). This is done most often.  Open repair. This method is used if an endovascular repair is not possible. Document Released: 07/14/2005 Document Revised: 01/29/2013 Document Reviewed: 11/03/2012 Exit[employer] Patient Information 2015 [employer]. This information is not intended to replace advice given to you by your health care provider. Make sure you discuss any questions you have with your health care provider.  
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
## 2015-02-04 DIAGNOSIS — E784 Other hyperlipidemia: Secondary | ICD-10-CM | POA: Diagnosis not present

## 2015-02-04 DIAGNOSIS — I10 Essential (primary) hypertension: Secondary | ICD-10-CM | POA: Diagnosis not present

## 2015-02-04 DIAGNOSIS — E039 Hypothyroidism, unspecified: Secondary | ICD-10-CM | POA: Diagnosis not present

## 2015-02-04 DIAGNOSIS — E119 Type 2 diabetes mellitus without complications: Secondary | ICD-10-CM | POA: Diagnosis not present

## 2015-02-04 DIAGNOSIS — M1991 Primary osteoarthritis, unspecified site: Secondary | ICD-10-CM | POA: Diagnosis not present

## 2015-02-11 DIAGNOSIS — Z23 Encounter for immunization: Secondary | ICD-10-CM | POA: Diagnosis not present

## 2015-02-11 DIAGNOSIS — Z1389 Encounter for screening for other disorder: Secondary | ICD-10-CM | POA: Diagnosis not present

## 2015-02-11 DIAGNOSIS — L570 Actinic keratosis: Secondary | ICD-10-CM | POA: Diagnosis not present

## 2015-02-11 DIAGNOSIS — J302 Other seasonal allergic rhinitis: Secondary | ICD-10-CM | POA: Diagnosis not present

## 2015-02-11 DIAGNOSIS — M1991 Primary osteoarthritis, unspecified site: Secondary | ICD-10-CM | POA: Diagnosis not present

## 2015-02-11 DIAGNOSIS — Z0001 Encounter for general adult medical examination with abnormal findings: Secondary | ICD-10-CM | POA: Diagnosis not present

## 2015-02-11 DIAGNOSIS — E039 Hypothyroidism, unspecified: Secondary | ICD-10-CM | POA: Diagnosis not present

## 2015-02-11 DIAGNOSIS — E119 Type 2 diabetes mellitus without complications: Secondary | ICD-10-CM | POA: Diagnosis not present

## 2015-02-11 DIAGNOSIS — E784 Other hyperlipidemia: Secondary | ICD-10-CM | POA: Diagnosis not present

## 2015-05-02 ENCOUNTER — Encounter: Payer: Self-pay | Admitting: Family

## 2015-05-06 ENCOUNTER — Ambulatory Visit (HOSPITAL_COMMUNITY)
Admission: RE | Admit: 2015-05-06 | Discharge: 2015-05-06 | Disposition: A | Discharge status: Home / Self Care | Payer: Commercial Managed Care - HMO | Source: Ambulatory Visit | Attending: Family | Admitting: Family

## 2015-05-06 ENCOUNTER — Encounter: Payer: Self-pay | Admitting: Family

## 2015-05-06 ENCOUNTER — Ambulatory Visit (INDEPENDENT_AMBULATORY_CARE_PROVIDER_SITE_OTHER): Payer: Commercial Managed Care - HMO | Admitting: Family

## 2015-05-06 VITALS — BP 126/68 | HR 67 | Temp 97.6°F | Resp 16 | Ht 72.0 in | Wt 233.0 lb

## 2015-05-06 DIAGNOSIS — I714 Abdominal aortic aneurysm, without rupture, unspecified: Secondary | ICD-10-CM

## 2015-05-06 DIAGNOSIS — Z87891 Personal history of nicotine dependence: Secondary | ICD-10-CM | POA: Diagnosis not present

## 2015-05-06 NOTE — Patient Instructions (Signed)
Abdominal Aortic Aneurysm An aneurysm is a weakened or damaged part of an artery wall that bulges from the normal force of blood pumping through the body. An abdominal aortic aneurysm is an aneurysm that occurs in the lower part of the aorta, the main artery of the body.  The major concern with an abdominal aortic aneurysm is that it can enlarge and burst (rupture) or blood can flow between the layers of the wall of the aorta through a tear (aorticdissection). Both of these conditions can cause bleeding inside the body and can be life threatening unless diagnosed and treated promptly. CAUSES  The exact cause of an abdominal aortic aneurysm is unknown. Some contributing factors are:   A hardening of the arteries caused by the buildup of fat and other substances in the lining of a blood vessel (arteriosclerosis).  Inflammation of the walls of an artery (arteritis).   Connective tissue diseases, such as Marfan syndrome.   Abdominal trauma.   An infection, such as syphilis or staphylococcus, in the wall of the aorta (infectious aortitis) caused by bacteria. RISK FACTORS  Risk factors that contribute to an abdominal aortic aneurysm may include:  Age older than 60 years.   High blood pressure (hypertension).  Male gender.  Ethnicity (white race).  Obesity.  Family history of aneurysm (first degree relatives only).  Tobacco use. PREVENTION  The following healthy lifestyle habits may help decrease your risk of abdominal aortic aneurysm:  Quitting smoking. Smoking can raise your blood pressure and cause arteriosclerosis.  Limiting or avoiding alcohol.  Keeping your blood pressure, blood sugar level, and cholesterol levels within normal limits.  Decreasing your salt intake. In somepeople, too much salt can raise blood pressure and increase your risk of abdominal aortic aneurysm.  Eating a diet low in saturated fats and cholesterol.  Increasing your fiber intake by including  whole grains, vegetables, and fruits in your diet. Eating these foods may help lower blood pressure.  Maintaining a healthy weight.  Staying physically active and exercising regularly. SYMPTOMS  The symptoms of abdominal aortic aneurysm may vary depending on the size and rate of growth of the aneurysm.Most grow slowly and do not have any symptoms. When symptoms do occur, they may include:  Pain (abdomen, side, lower back, or groin). The pain may vary in intensity. A sudden onset of severe pain may indicate that the aneurysm has ruptured.  Feeling full after eating only small amounts of food.  Nausea or vomiting or both.  Feeling a pulsating lump in the abdomen.  Feeling faint or passing out. DIAGNOSIS  Since most unruptured abdominal aortic aneurysms have no symptoms, they are often discovered during diagnostic exams for other conditions. An aneurysm may be found during the following procedures:  Ultrasonography (A one-time screening for abdominal aortic aneurysm by ultrasonography is also recommended for all men aged 65-75 years who have ever smoked).  X-ray exams.  A computed tomography (CT).  Magnetic resonance imaging (MRI).  Angiography or arteriography. TREATMENT  Treatment of an abdominal aortic aneurysm depends on the size of your aneurysm, your age, and risk factors for rupture. Medication to control blood pressure and pain may be used to manage aneurysms smaller than 6 cm. Regular monitoring for enlargement may be recommended by your caregiver if:  The aneurysm is 3-4 cm in size (an annual ultrasonography may be recommended).  The aneurysm is 4-4.5 cm in size (an ultrasonography every 6 months may be recommended).  The aneurysm is larger than 4.5 cm in   size (your caregiver may ask that you be examined by a vascular surgeon). If your aneurysm is larger than 6 cm, surgical repair may be recommended. There are two main methods for repair of an aneurysm:   Endovascular  repair (a minimally invasive surgery). This is done most often.  Open repair. This method is used if an endovascular repair is not possible. Document Released: 07/14/2005 Document Revised: 01/29/2013 Document Reviewed: 11/03/2012 Exit[employer] Patient Information 2015 [employer]. This information is not intended to replace advice given to you by your health care provider. Make sure you discuss any questions you have with your health care provider.  
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
## 2015-05-07 NOTE — Addendum Note (Signed)
Addended by: Adria DillELDRIDGE-LEWIS, Yiselle Babich L on: 05/07/2015 05:04 PM   Modules accepted: Orders

## 2015-07-23 DIAGNOSIS — Z23 Encounter for immunization: Secondary | ICD-10-CM | POA: Diagnosis not present

## 2015-07-23 DIAGNOSIS — E039 Hypothyroidism, unspecified: Secondary | ICD-10-CM | POA: Diagnosis not present

## 2015-07-23 DIAGNOSIS — E119 Type 2 diabetes mellitus without complications: Secondary | ICD-10-CM | POA: Diagnosis not present

## 2015-07-23 DIAGNOSIS — E784 Other hyperlipidemia: Secondary | ICD-10-CM | POA: Diagnosis not present

## 2015-07-23 DIAGNOSIS — M1991 Primary osteoarthritis, unspecified site: Secondary | ICD-10-CM | POA: Diagnosis not present

## 2015-07-23 DIAGNOSIS — I10 Essential (primary) hypertension: Secondary | ICD-10-CM | POA: Diagnosis not present

## 2015-07-30 DIAGNOSIS — I10 Essential (primary) hypertension: Secondary | ICD-10-CM | POA: Diagnosis not present

## 2015-07-30 DIAGNOSIS — E039 Hypothyroidism, unspecified: Secondary | ICD-10-CM | POA: Diagnosis not present

## 2015-07-30 DIAGNOSIS — E119 Type 2 diabetes mellitus without complications: Secondary | ICD-10-CM | POA: Diagnosis not present

## 2015-07-30 DIAGNOSIS — E784 Other hyperlipidemia: Secondary | ICD-10-CM | POA: Diagnosis not present

## 2015-07-30 DIAGNOSIS — I714 Abdominal aortic aneurysm, without rupture: Secondary | ICD-10-CM | POA: Diagnosis not present

## 2015-07-30 DIAGNOSIS — M1991 Primary osteoarthritis, unspecified site: Secondary | ICD-10-CM | POA: Diagnosis not present

## 2015-07-30 DIAGNOSIS — R351 Nocturia: Secondary | ICD-10-CM | POA: Diagnosis not present

## 2015-07-30 DIAGNOSIS — J302 Other seasonal allergic rhinitis: Secondary | ICD-10-CM | POA: Diagnosis not present

## 2015-11-04 ENCOUNTER — Encounter: Payer: Self-pay | Admitting: Family

## 2015-11-04 DIAGNOSIS — H5210 Myopia, unspecified eye: Secondary | ICD-10-CM | POA: Diagnosis not present

## 2015-11-04 DIAGNOSIS — H524 Presbyopia: Secondary | ICD-10-CM | POA: Diagnosis not present

## 2015-11-11 ENCOUNTER — Encounter: Payer: Self-pay | Admitting: Family

## 2015-11-11 ENCOUNTER — Ambulatory Visit (INDEPENDENT_AMBULATORY_CARE_PROVIDER_SITE_OTHER): Payer: Commercial Managed Care - HMO | Admitting: Family

## 2015-11-11 ENCOUNTER — Ambulatory Visit (HOSPITAL_COMMUNITY)
Admission: RE | Admit: 2015-11-11 | Discharge: 2015-11-11 | Disposition: A | Discharge status: Home / Self Care | Payer: Commercial Managed Care - HMO | Source: Ambulatory Visit | Attending: Family | Admitting: Family

## 2015-11-11 VITALS — BP 128/86 | HR 84 | Temp 98.0°F | Resp 16 | Ht 72.0 in | Wt 240.0 lb

## 2015-11-11 DIAGNOSIS — I714 Abdominal aortic aneurysm, without rupture, unspecified: Secondary | ICD-10-CM

## 2015-11-11 DIAGNOSIS — E119 Type 2 diabetes mellitus without complications: Secondary | ICD-10-CM | POA: Insufficient documentation

## 2015-11-11 DIAGNOSIS — M79645 Pain in left finger(s): Secondary | ICD-10-CM | POA: Diagnosis not present

## 2015-11-11 DIAGNOSIS — Z87891 Personal history of nicotine dependence: Secondary | ICD-10-CM

## 2015-11-11 DIAGNOSIS — S6702XA Crushing injury of left thumb, initial encounter: Secondary | ICD-10-CM | POA: Diagnosis not present

## 2015-11-11 DIAGNOSIS — I10 Essential (primary) hypertension: Secondary | ICD-10-CM | POA: Insufficient documentation

## 2015-11-11 DIAGNOSIS — S61012A Laceration without foreign body of left thumb without damage to nail, initial encounter: Secondary | ICD-10-CM | POA: Diagnosis not present
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
## 2015-11-11 NOTE — Addendum Note (Signed)
Addended by: Adria Dill L on: 11/11/2015 10:29 AM   Modules accepted: Orders

## 2015-11-11 NOTE — Patient Instructions (Signed)
Abdominal Aortic Aneurysm An aneurysm is a weakened or damaged part of an artery wall that bulges from the normal force of blood pumping through the body. An abdominal aortic aneurysm is an aneurysm that occurs in the lower part of the aorta, the main artery of the body.  The major concern with an abdominal aortic aneurysm is that it can enlarge and burst (rupture) or blood can flow between the layers of the wall of the aorta through a tear (aorticdissection). Both of these conditions can cause bleeding inside the body and can be life threatening unless diagnosed and treated promptly. CAUSES  The exact cause of an abdominal aortic aneurysm is unknown. Some contributing factors are:   A hardening of the arteries caused by the buildup of fat and other substances in the lining of a blood vessel (arteriosclerosis).  Inflammation of the walls of an artery (arteritis).   Connective tissue diseases, such as Marfan syndrome.   Abdominal trauma.   An infection, such as syphilis or staphylococcus, in the wall of the aorta (infectious aortitis) caused by bacteria. RISK FACTORS  Risk factors that contribute to an abdominal aortic aneurysm may include:  Age older than 60 years.   High blood pressure (hypertension).  Male gender.  Ethnicity (white race).  Obesity.  Family history of aneurysm (first degree relatives only).  Tobacco use. PREVENTION  The following healthy lifestyle habits may help decrease your risk of abdominal aortic aneurysm:  Quitting smoking. Smoking can raise your blood pressure and cause arteriosclerosis.  Limiting or avoiding alcohol.  Keeping your blood pressure, blood sugar level, and cholesterol levels within normal limits.  Decreasing your salt intake. In somepeople, too much salt can raise blood pressure and increase your risk of abdominal aortic aneurysm.  Eating a diet low in saturated fats and cholesterol.  Increasing your fiber intake by including  whole grains, vegetables, and fruits in your diet. Eating these foods may help lower blood pressure.  Maintaining a healthy weight.  Staying physically active and exercising regularly. SYMPTOMS  The symptoms of abdominal aortic aneurysm may vary depending on the size and rate of growth of the aneurysm.Most grow slowly and do not have any symptoms. When symptoms do occur, they may include:  Pain (abdomen, side, lower back, or groin). The pain may vary in intensity. A sudden onset of severe pain may indicate that the aneurysm has ruptured.  Feeling full after eating only small amounts of food.  Nausea or vomiting or both.  Feeling a pulsating lump in the abdomen.  Feeling faint or passing out. DIAGNOSIS  Since most unruptured abdominal aortic aneurysms have no symptoms, they are often discovered during diagnostic exams for other conditions. An aneurysm may be found during the following procedures:  Ultrasonography (A one-time screening for abdominal aortic aneurysm by ultrasonography is also recommended for all men aged 65-75 years who have ever smoked).  X-ray exams.  A computed tomography (CT).  Magnetic resonance imaging (MRI).  Angiography or arteriography. TREATMENT  Treatment of an abdominal aortic aneurysm depends on the size of your aneurysm, your age, and risk factors for rupture. Medication to control blood pressure and pain may be used to manage aneurysms smaller than 6 cm. Regular monitoring for enlargement may be recommended by your caregiver if:  The aneurysm is 3-4 cm in size (an annual ultrasonography may be recommended).  The aneurysm is 4-4.5 cm in size (an ultrasonography every 6 months may be recommended).  The aneurysm is larger than 4.5 cm in   size (your caregiver may ask that you be examined by a vascular surgeon). If your aneurysm is larger than 6 cm, surgical repair may be recommended. There are two main methods for repair of an aneurysm:   Endovascular  repair (a minimally invasive surgery). This is done most often.  Open repair. This method is used if an endovascular repair is not possible.   This information is not intended to replace advice given to you by your health care provider. Make sure you discuss any questions you have with your health care provider.   Document Released: 07/14/2005 Document Revised: 01/29/2013 Document Reviewed: 11/03/2012 Elsevier Interactive Patient Education 2016 [employer] Inc[employer]  

## 2015-11-12 DIAGNOSIS — Z87891 Personal history of nicotine dependence: Secondary | ICD-10-CM | POA: Diagnosis not present

## 2015-11-12 DIAGNOSIS — I714 Abdominal aortic aneurysm, without rupture: Secondary | ICD-10-CM | POA: Diagnosis not present

## 2016-01-20 DIAGNOSIS — E039 Hypothyroidism, unspecified: Secondary | ICD-10-CM | POA: Diagnosis not present

## 2016-01-20 DIAGNOSIS — M1991 Primary osteoarthritis, unspecified site: Secondary | ICD-10-CM | POA: Diagnosis not present

## 2016-01-20 DIAGNOSIS — I714 Abdominal aortic aneurysm, without rupture: Secondary | ICD-10-CM | POA: Diagnosis not present

## 2016-01-20 DIAGNOSIS — E784 Other hyperlipidemia: Secondary | ICD-10-CM | POA: Diagnosis not present

## 2016-01-20 DIAGNOSIS — E1169 Type 2 diabetes mellitus with other specified complication: Secondary | ICD-10-CM | POA: Diagnosis not present

## 2016-01-20 DIAGNOSIS — I10 Essential (primary) hypertension: Secondary | ICD-10-CM | POA: Diagnosis not present

## 2016-01-20 DIAGNOSIS — E119 Type 2 diabetes mellitus without complications: Secondary | ICD-10-CM | POA: Diagnosis not present

## 2016-01-26 DIAGNOSIS — R351 Nocturia: Secondary | ICD-10-CM | POA: Diagnosis not present

## 2016-01-26 DIAGNOSIS — I714 Abdominal aortic aneurysm, without rupture: Secondary | ICD-10-CM | POA: Diagnosis not present

## 2016-01-26 DIAGNOSIS — M1991 Primary osteoarthritis, unspecified site: Secondary | ICD-10-CM | POA: Diagnosis not present

## 2016-01-26 DIAGNOSIS — L570 Actinic keratosis: Secondary | ICD-10-CM | POA: Diagnosis not present

## 2016-01-26 DIAGNOSIS — E119 Type 2 diabetes mellitus without complications: Secondary | ICD-10-CM | POA: Diagnosis not present

## 2016-01-26 DIAGNOSIS — E039 Hypothyroidism, unspecified: Secondary | ICD-10-CM | POA: Diagnosis not present

## 2016-01-26 DIAGNOSIS — I10 Essential (primary) hypertension: Secondary | ICD-10-CM | POA: Diagnosis not present

## 2016-01-26 DIAGNOSIS — E784 Other hyperlipidemia: Secondary | ICD-10-CM | POA: Diagnosis not present

## 2016-05-06 ENCOUNTER — Encounter: Payer: Self-pay | Admitting: Family

## 2016-05-10 ENCOUNTER — Other Ambulatory Visit (HOSPITAL_COMMUNITY): Payer: Commercial Managed Care - HMO

## 2016-05-10 ENCOUNTER — Ambulatory Visit: Payer: Commercial Managed Care - HMO | Admitting: Family

## 2016-05-12 ENCOUNTER — Ambulatory Visit (INDEPENDENT_AMBULATORY_CARE_PROVIDER_SITE_OTHER): Payer: Commercial Managed Care - HMO | Admitting: Family

## 2016-05-12 ENCOUNTER — Ambulatory Visit (HOSPITAL_COMMUNITY)
Admission: RE | Admit: 2016-05-12 | Discharge: 2016-05-12 | Disposition: A | Discharge status: Home / Self Care | Payer: Commercial Managed Care - HMO | Source: Ambulatory Visit | Attending: Family | Admitting: Family

## 2016-05-12 ENCOUNTER — Encounter: Payer: Self-pay | Admitting: Family

## 2016-05-12 VITALS — BP 130/76 | HR 70 | Temp 97.2°F | Ht 72.0 in | Wt 234.0 lb

## 2016-05-12 DIAGNOSIS — E079 Disorder of thyroid, unspecified: Secondary | ICD-10-CM | POA: Insufficient documentation

## 2016-05-12 DIAGNOSIS — I714 Abdominal aortic aneurysm, without rupture, unspecified: Secondary | ICD-10-CM

## 2016-05-12 DIAGNOSIS — I10 Essential (primary) hypertension: Secondary | ICD-10-CM | POA: Diagnosis not present

## 2016-05-12 DIAGNOSIS — Z87891 Personal history of nicotine dependence: Secondary | ICD-10-CM | POA: Diagnosis not present

## 2016-05-12 DIAGNOSIS — E119 Type 2 diabetes mellitus without complications: Secondary | ICD-10-CM | POA: Diagnosis not present

## 2016-05-12 NOTE — Patient Instructions (Signed)
Abdominal Aortic Aneurysm An aneurysm is a weakened or damaged part of an artery wall that bulges from the normal force of blood pumping through the body. An abdominal aortic aneurysm is an aneurysm that occurs in the lower part of the aorta, the main artery of the body.  The major concern with an abdominal aortic aneurysm is that it can enlarge and burst (rupture) or blood can flow between the layers of the wall of the aorta through a tear (aorticdissection). Both of these conditions can cause bleeding inside the body and can be life threatening unless diagnosed and treated promptly. CAUSES  The exact cause of an abdominal aortic aneurysm is unknown. Some contributing factors are:   A hardening of the arteries caused by the buildup of fat and other substances in the lining of a blood vessel (arteriosclerosis).  Inflammation of the walls of an artery (arteritis).   Connective tissue diseases, such as Marfan syndrome.   Abdominal trauma.   An infection, such as syphilis or staphylococcus, in the wall of the aorta (infectious aortitis) caused by bacteria. RISK FACTORS  Risk factors that contribute to an abdominal aortic aneurysm may include:  Age older than 60 years.   High blood pressure (hypertension).  Male gender.  Ethnicity (white race).  Obesity.  Family history of aneurysm (first degree relatives only).  Tobacco use. PREVENTION  The following healthy lifestyle habits may help decrease your risk of abdominal aortic aneurysm:  Quitting smoking. Smoking can raise your blood pressure and cause arteriosclerosis.  Limiting or avoiding alcohol.  Keeping your blood pressure, blood sugar level, and cholesterol levels within normal limits.  Decreasing your salt intake. In somepeople, too much salt can raise blood pressure and increase your risk of abdominal aortic aneurysm.  Eating a diet low in saturated fats and cholesterol.  Increasing your fiber intake by including  whole grains, vegetables, and fruits in your diet. Eating these foods may help lower blood pressure.  Maintaining a healthy weight.  Staying physically active and exercising regularly. SYMPTOMS  The symptoms of abdominal aortic aneurysm may vary depending on the size and rate of growth of the aneurysm.Most grow slowly and do not have any symptoms. When symptoms do occur, they may include:  Pain (abdomen, side, lower back, or groin). The pain may vary in intensity. A sudden onset of severe pain may indicate that the aneurysm has ruptured.  Feeling full after eating only small amounts of food.  Nausea or vomiting or both.  Feeling a pulsating lump in the abdomen.  Feeling faint or passing out. DIAGNOSIS  Since most unruptured abdominal aortic aneurysms have no symptoms, they are often discovered during diagnostic exams for other conditions. An aneurysm may be found during the following procedures:  Ultrasonography (A one-time screening for abdominal aortic aneurysm by ultrasonography is also recommended for all men aged 65-75 years who have ever smoked).  X-ray exams.  A computed tomography (CT).  Magnetic resonance imaging (MRI).  Angiography or arteriography. TREATMENT  Treatment of an abdominal aortic aneurysm depends on the size of your aneurysm, your age, and risk factors for rupture. Medication to control blood pressure and pain may be used to manage aneurysms smaller than 6 cm. Regular monitoring for enlargement may be recommended by your caregiver if:  The aneurysm is 3-4 cm in size (an annual ultrasonography may be recommended).  The aneurysm is 4-4.5 cm in size (an ultrasonography every 6 months may be recommended).  The aneurysm is larger than 4.5 cm in   size (your caregiver may ask that you be examined by a vascular surgeon). If your aneurysm is larger than 6 cm, surgical repair may be recommended. There are two main methods for repair of an aneurysm:   Endovascular  repair (a minimally invasive surgery). This is done most often.  Open repair. This method is used if an endovascular repair is not possible.   This information is not intended to replace advice given to you by your health care provider. Make sure you discuss any questions you have with your health care provider.   Document Released: 07/14/2005 Document Revised: 01/29/2013 Document Reviewed: 11/03/2012 Elsevier Interactive Patient Education 2016 Elsevier Inc.    Before your next abdominal ultrasound:  Take two Extra-Strength Gas-X capsules at bedtime the night before the test. Take another two Extra-Strength Gas-X capsules 3 hours before the test.   
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
## 2016-08-12 DIAGNOSIS — M1991 Primary osteoarthritis, unspecified site: Secondary | ICD-10-CM | POA: Diagnosis not present

## 2016-08-12 DIAGNOSIS — E119 Type 2 diabetes mellitus without complications: Secondary | ICD-10-CM | POA: Diagnosis not present

## 2016-08-12 DIAGNOSIS — R351 Nocturia: Secondary | ICD-10-CM | POA: Diagnosis not present

## 2016-08-12 DIAGNOSIS — E784 Other hyperlipidemia: Secondary | ICD-10-CM | POA: Diagnosis not present

## 2016-08-12 DIAGNOSIS — I714 Abdominal aortic aneurysm, without rupture: Secondary | ICD-10-CM | POA: Diagnosis not present

## 2016-08-12 DIAGNOSIS — I10 Essential (primary) hypertension: Secondary | ICD-10-CM | POA: Diagnosis not present

## 2016-08-12 DIAGNOSIS — E039 Hypothyroidism, unspecified: Secondary | ICD-10-CM | POA: Diagnosis not present

## 2016-08-16 DIAGNOSIS — E784 Other hyperlipidemia: Secondary | ICD-10-CM | POA: Diagnosis not present

## 2016-08-16 DIAGNOSIS — Z0001 Encounter for general adult medical examination with abnormal findings: Secondary | ICD-10-CM | POA: Diagnosis not present

## 2016-08-16 DIAGNOSIS — M1991 Primary osteoarthritis, unspecified site: Secondary | ICD-10-CM | POA: Diagnosis not present

## 2016-08-16 DIAGNOSIS — I714 Abdominal aortic aneurysm, without rupture: Secondary | ICD-10-CM | POA: Diagnosis not present

## 2016-08-16 DIAGNOSIS — E039 Hypothyroidism, unspecified: Secondary | ICD-10-CM | POA: Diagnosis not present

## 2016-08-16 DIAGNOSIS — E1143 Type 2 diabetes mellitus with diabetic autonomic (poly)neuropathy: Secondary | ICD-10-CM | POA: Diagnosis not present

## 2016-08-16 DIAGNOSIS — I10 Essential (primary) hypertension: Secondary | ICD-10-CM | POA: Diagnosis not present

## 2016-08-16 DIAGNOSIS — E119 Type 2 diabetes mellitus without complications: Secondary | ICD-10-CM | POA: Diagnosis not present

## 2016-08-20 LAB — BASIC METABOLIC PANEL: Glucose: 150 mg/dL

## 2016-08-25 DIAGNOSIS — I517 Cardiomegaly: Secondary | ICD-10-CM | POA: Diagnosis not present

## 2016-08-25 DIAGNOSIS — R011 Cardiac murmur, unspecified: Secondary | ICD-10-CM | POA: Diagnosis not present

## 2016-09-02 ENCOUNTER — Other Ambulatory Visit: Payer: Self-pay | Admitting: *Deleted

## 2016-09-02 DIAGNOSIS — I714 Abdominal aortic aneurysm, without rupture, unspecified: Secondary | ICD-10-CM
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
## 2016-10-26 NOTE — Congregational Nurse Program (Signed)
B/P 155/73, Pulse 70  .Blood sugar 83,checks Bl. Sugar daily,  goes one time a year for vision check and Abd. Check (aneursym)  Non- smoker times 10 years.

## 2016-11-16 DIAGNOSIS — I10 Essential (primary) hypertension: Secondary | ICD-10-CM | POA: Diagnosis not present

## 2016-11-16 DIAGNOSIS — E039 Hypothyroidism, unspecified: Secondary | ICD-10-CM | POA: Diagnosis not present

## 2016-11-16 DIAGNOSIS — E1143 Type 2 diabetes mellitus with diabetic autonomic (poly)neuropathy: Secondary | ICD-10-CM | POA: Diagnosis not present

## 2016-11-17 ENCOUNTER — Ambulatory Visit: Payer: Commercial Managed Care - HMO | Admitting: Family

## 2016-11-17 ENCOUNTER — Other Ambulatory Visit (HOSPITAL_COMMUNITY): Payer: Commercial Managed Care - HMO

## 2016-11-30 ENCOUNTER — Encounter: Payer: Self-pay | Admitting: Family

## 2016-12-08 ENCOUNTER — Ambulatory Visit (INDEPENDENT_AMBULATORY_CARE_PROVIDER_SITE_OTHER): Payer: Medicare HMO | Admitting: Family

## 2016-12-08 ENCOUNTER — Encounter: Payer: Self-pay | Admitting: Family

## 2016-12-08 ENCOUNTER — Ambulatory Visit (HOSPITAL_COMMUNITY)
Admission: RE | Admit: 2016-12-08 | Discharge: 2016-12-08 | Disposition: A | Discharge status: Home / Self Care | Payer: Medicare HMO | Source: Ambulatory Visit | Attending: Family | Admitting: Family

## 2016-12-08 VITALS — BP 126/81 | HR 67 | Temp 98.1°F | Resp 20 | Ht 72.0 in | Wt 236.0 lb

## 2016-12-08 DIAGNOSIS — Z87891 Personal history of nicotine dependence: Secondary | ICD-10-CM | POA: Diagnosis not present

## 2016-12-08 DIAGNOSIS — I714 Abdominal aortic aneurysm, without rupture, unspecified: Secondary | ICD-10-CM
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
## 2016-12-08 NOTE — Patient Instructions (Addendum)
Abdominal Aortic Aneurysm Blood pumps away from the heart through tubes (blood vessels) called arteries. Aneurysms are weak or damaged places in the wall of an artery. It bulges out like a balloon. An abdominal aortic aneurysm happens in the main artery of the body (aorta). It can burst or tear, causing bleeding inside the body. This is an emergency. It needs treatment right away. What are the causes? The exact cause is unknown. Things that could cause this problem include:  Fat and other substances building up in the lining of a tube.  Swelling of the walls of a blood vessel.  Certain tissue diseases.  Belly (abdominal) trauma.  An infection in the main artery of the body.  What increases the risk? There are things that make it more likely for you to have an aneurysm. These include:  Being over the age of 81 years old.  Having high blood pressure (hypertension).  Being a male.  Being white.  Being very overweight (obese).  Having a family history of aneurysm.  Using tobacco products.  What are the signs or symptoms? Symptoms depend on the size of the aneurysm and how fast it grows. There may not be symptoms. If symptoms occur, they can include:  Pain (belly, side, lower back, or groin).  Feeling full after eating a small amount of food.  Feeling sick to your stomach (nauseous), throwing up (vomiting), or both.  Feeling a lump in your belly that feels like it is beating (pulsating).  Feeling like you will pass out (faint).  How is this treated?  Medicine to control blood pressure and pain.  Imaging tests to see if the aneurysm gets bigger.  Surgery. How is this prevented? To lessen your chance of getting this condition:  Stop smoking. Stop chewing tobacco.  Limit or avoid alcohol.  Keep your blood pressure, blood sugar, and cholesterol within normal limits.  Eat less salt.  Eat foods low in saturated fats and cholesterol. These are found in animal and  whole dairy products.  Eat more fiber. Fiber is found in whole grains, vegetables, and fruits.  Keep a healthy weight.  Stay active and exercise often.  This information is not intended to replace advice given to you by your health care provider. Make sure you discuss any questions you have with your health care provider. Document Released: 01/29/2013 Document Revised: 03/11/2016 Document Reviewed: 11/03/2012 Elsevier Interactive Patient Education  2017 Elsevier Inc.  

## 2016-12-09 NOTE — Addendum Note (Signed)
Addended by: Burton ApleyPETTY, Olivier Frayre A on: 12/09/2016 02:48 PM   Modules accepted: Orders

## 2016-12-24 ENCOUNTER — Encounter: Payer: Self-pay | Admitting: Vascular Surgery

## 2016-12-28 ENCOUNTER — Ambulatory Visit (HOSPITAL_COMMUNITY): Payer: Medicare HMO

## 2016-12-30 ENCOUNTER — Ambulatory Visit (HOSPITAL_COMMUNITY)
Admission: RE | Admit: 2016-12-30 | Discharge: 2016-12-30 | Disposition: A | Discharge status: Home / Self Care | Payer: Medicare HMO | Source: Ambulatory Visit | Attending: Family | Admitting: Family

## 2016-12-30 DIAGNOSIS — I714 Abdominal aortic aneurysm, without rupture, unspecified: Secondary | ICD-10-CM

## 2016-12-30 DIAGNOSIS — K8020 Calculus of gallbladder without cholecystitis without obstruction: Secondary | ICD-10-CM | POA: Insufficient documentation

## 2016-12-30 DIAGNOSIS — K5730 Diverticulosis of large intestine without perforation or abscess without bleeding: Secondary | ICD-10-CM | POA: Diagnosis not present

## 2016-12-30 LAB — POCT I-STAT CREATININE: Creatinine, Ser: 1.2 mg/dL (ref 0.61–1.24)

## 2016-12-30 MED ORDER — IOPAMIDOL (ISOVUE-300) INJECTION 61%: 100.0000 mL | Freq: Once | INTRAVENOUS | Status: DC | PRN

## 2016-12-30 MED ORDER — IOPAMIDOL (ISOVUE-370) INJECTION 76%
100.0000 mL | Freq: Once | INTRAVENOUS | Status: AC | PRN
  Administered 2016-12-30: 100 mL via INTRAVENOUS

## 2017-01-05 ENCOUNTER — Ambulatory Visit (INDEPENDENT_AMBULATORY_CARE_PROVIDER_SITE_OTHER): Payer: Medicare HMO | Admitting: Vascular Surgery

## 2017-01-05 ENCOUNTER — Encounter: Payer: Self-pay | Admitting: Vascular Surgery

## 2017-01-05 VITALS — BP 136/74 | HR 74 | Temp 98.5°F | Resp 18 | Ht 72.0 in | Wt 241.8 lb

## 2017-01-05 DIAGNOSIS — I714 Abdominal aortic aneurysm, without rupture, unspecified: Secondary | ICD-10-CM

## 2017-01-05 NOTE — Progress Notes (Signed)
Patient name: Adrian Hester MRN: [id number] DOB: 09-21-1930 Sex: male  REASON FOR VISIT: Follow up of abdominal aortic aneurysm.  HPI: Adrian Hester is a 83 y.o. male who was previously followed with an abdominal aortic aneurysm by Dr. Hart RochesterLawson. On his most recent follow up visit with the nurse practitioner, there was limited visualization on his duplex scan and therefore he was set up for a CT angiogram. He comes in to discuss those results.  The patient denies any abdominal pain or back pain. He quit tobacco 10-12 years ago. He states that his blood pressure has been under good control.  He does have a family history of aneurysmal disease. His brother died in his late 6070s with an abdominal aortic aneurysm.  Past Medical History:  Diagnosis Date  . AAA (abdominal aortic aneurysm) [facility](HCC) Sept. 2013  and  Feb. 2014   Infrarenal   . Allergy    Rhinitis, Hayfever  . Anemia   . Arthritis    Osteoarthritis  . Diabetes mellitus    Type II  . Edema, lower extremity    Left ankle trauma  . Hepatomegaly Sept. 2013   Fatty Liver  . Hx of epistaxis   . Hypertension   . Nocturia   . Nocturia   . Splenomegaly Sept. 2013   Benign splenic hemangioma  . Thyroid disease    Hypothyroidism, Chronic    Family History  Problem Relation Age of Onset  . Cancer Mother     Colon  . Stroke Mother   . Cancer Father   . Diabetes Father   . Cancer Brother     Prostate  . Heart disease Brother     After age 81  . AAA (abdominal aortic aneurysm) Brother   . Heart attack Brother   . Cancer Brother     Prostate  . Hypertension Brother   . Heart disease Sister   . Heart attack Sister   . Hyperlipidemia Daughter   . Hypertension Daughter   . Cancer Daughter     Breast  . Hyperlipidemia Son   . Hypertension Son   . Cancer Brother   . Diabetes Brother     Glan    SOCIAL HISTORY: Social History  Substance Use Topics  . Smoking status: Former Smoker    Quit date: 06/03/2011  . Smokeless  tobacco: Never Used  . Alcohol use No    No Known Allergies  Current Outpatient Prescriptions  Medication Sig Dispense Refill  . amLODipine (NORVASC) 10 MG tablet Take 10 mg by mouth daily.    Marland Kitchen. aspirin 81 MG tablet Take 81 mg by mouth daily.    Marland Kitchen. atorvastatin (LIPITOR) 10 MG tablet Take 10 mg by mouth daily.     . benazepril (LOTENSIN) 40 MG tablet Take 40 mg by mouth daily.    . clotrimazole-betamethasone (LOTRISONE) cream Apply topically 2 (two) times daily. Reported on 11/11/2015    . fish oil-omega-3 fatty acids 1000 MG capsule Take 2 g by mouth 2 (two) times daily. Reported on 11/11/2015    . fluticasone (FLONASE) 50 MCG/ACT nasal spray Place 2 sprays into the nose daily.    Marland Kitchen. levothyroxine (SYNTHROID, LEVOTHROID) 50 MCG tablet Take 50 mcg by mouth daily.    Marland Kitchen. loratadine (CLARITIN) 10 MG tablet Take 10 mg by mouth 2 (two) times daily.     . metFORMIN (GLUCOPHAGE) 500 MG tablet Take 500 mg by mouth 2 (two) times daily with a meal.    .  ONE TOUCH ULTRA TEST test strip     . pseudoephedrine (SUDAFED) 30 MG tablet Take 30 mg by mouth every 4 (four) hours as needed for congestion.    . Simethicone (GAS RELIEF 125 MAX ST PO) Take by mouth.     No current facility-administered medications for this visit.     REVIEW OF SYSTEMS:  [X]  denotes positive finding, [ ]  denotes negative finding Cardiac  Comments:  Chest pain or chest pressure:    Shortness of breath upon exertion:    Short of breath when lying flat:    Irregular heart rhythm:        Vascular    Pain in calf, thigh, or hip brought on by ambulation:    Pain in feet at night that wakes you up from your sleep:     Blood clot in your veins:    Leg swelling:  X       Pulmonary    Oxygen at home:    Productive cough:     Wheezing:         Neurologic    Sudden weakness in arms or legs:     Sudden numbness in arms or legs:     Sudden onset of difficulty speaking or slurred speech:    Temporary loss of vision in one eye:      Problems with dizziness:         Gastrointestinal    Blood in stool:     Vomited blood:         Genitourinary    Burning when urinating:     Blood in urine:        Psychiatric    Major depression:         Hematologic    Bleeding problems:    Problems with blood clotting too easily:        Skin    Rashes or ulcers:        Constitutional    Fever or chills:      PHYSICAL EXAM: Vitals:   01/05/17 1217  BP: 136/74  Pulse: 74  Resp: 18  Temp: 98.5 F (36.9 C)  TempSrc: Oral  SpO2: 95%  Weight: 241 lb 12.8 oz (109.7 kg)  Height: 6' (1.829 m)    GENERAL: The patient is a well-nourished male, in no acute distress. The vital signs are documented above. CARDIAC: There is a regular rate and rhythm.  VASCULAR: I do not detect carotid bruits. He has palpable femoral pulses. He has a palpable left dorsalis pedis pulse. Both feet are warm and well-perfused. He has mild left lower extremity swelling. PULMONARY: There is good air exchange bilaterally without wheezing or rales. ABDOMEN: Soft and non-tender with normal pitched bowel sounds.  MUSCULOSKELETAL: There are no major deformities or cyanosis. NEUROLOGIC: No focal weakness or paresthesias are detected. SKIN: There are no ulcers or rashes noted. PSYCHIATRIC: The patient has a normal affect.  DATA:   CT ANGIOGRAM ABDOMEN AND PELVIS: I have reviewed his CT angiogram that was done on 12/30/2016. This shows that the maximum diameter of his infrarenal aneurysm is 4.6 cm which is stable in size.  MEDICAL ISSUES:  4.6 CM INFRARENAL ABDOMINAL AORTIC ANEURYSM: His aneurysm has been stable in size over the last year. He understands we would not consider elective repair of the aneurysm unless it reached 5.5 cm in a normal risk patient. The duplex can did correlate with the CT scan and he does not have any significant iliac  artery aneurysmal disease. For this reason I think it safe to do a duplex at his next follow up visit. I  have ordered a duplex can 6 months and also back at that time. He knows to call sooner if he has problems. Of note, in reviewing the CT scan, he has a fairly short neck but might potentially be a candidate for an endovascular repair if the aneurysm enlarges to 5.5 cm or greater.   Waverly Ferrari [facility] [place] [phone]

## 2017-01-06 NOTE — Addendum Note (Signed)
Addended by: Burton ApleyPETTY, Nikeia Henkes A on: 01/06/2017 02:06 PM   Modules accepted: Orders

## 2017-02-07 DIAGNOSIS — I517 Cardiomegaly: Secondary | ICD-10-CM | POA: Diagnosis not present

## 2017-02-07 DIAGNOSIS — I10 Essential (primary) hypertension: Secondary | ICD-10-CM | POA: Diagnosis not present

## 2017-02-07 DIAGNOSIS — E784 Other hyperlipidemia: Secondary | ICD-10-CM | POA: Diagnosis not present

## 2017-02-07 DIAGNOSIS — R011 Cardiac murmur, unspecified: Secondary | ICD-10-CM | POA: Diagnosis not present

## 2017-02-07 DIAGNOSIS — I714 Abdominal aortic aneurysm, without rupture: Secondary | ICD-10-CM | POA: Diagnosis not present

## 2017-02-07 DIAGNOSIS — E039 Hypothyroidism, unspecified: Secondary | ICD-10-CM | POA: Diagnosis not present

## 2017-02-07 DIAGNOSIS — E119 Type 2 diabetes mellitus without complications: Secondary | ICD-10-CM | POA: Diagnosis not present

## 2017-02-07 DIAGNOSIS — I5032 Chronic diastolic (congestive) heart failure: Secondary | ICD-10-CM | POA: Diagnosis not present

## 2017-06-28 DIAGNOSIS — H524 Presbyopia: Secondary | ICD-10-CM | POA: Diagnosis not present

## 2017-07-13 ENCOUNTER — Ambulatory Visit (HOSPITAL_COMMUNITY)
Admission: RE | Admit: 2017-07-13 | Discharge: 2017-07-13 | Disposition: A | Discharge status: Home / Self Care | Payer: Medicare HMO | Source: Ambulatory Visit | Attending: Vascular Surgery | Admitting: Vascular Surgery

## 2017-07-13 ENCOUNTER — Encounter: Payer: Self-pay | Admitting: Vascular Surgery

## 2017-07-13 ENCOUNTER — Ambulatory Visit (INDEPENDENT_AMBULATORY_CARE_PROVIDER_SITE_OTHER): Payer: Medicare HMO | Admitting: Vascular Surgery

## 2017-07-13 VITALS — BP 147/78 | HR 66 | Temp 97.3°F | Ht 72.0 in | Wt 244.0 lb

## 2017-07-13 DIAGNOSIS — I714 Abdominal aortic aneurysm, without rupture, unspecified: Secondary | ICD-10-CM

## 2017-07-13 NOTE — Progress Notes (Signed)
Patient name: Adrian Hester MRN: [id number] DOB: [birth year] Sex: male  REASON FOR VISIT:    Follow up of abdominal aortic aneurysm.  HPI:   Adrian Hester is a pleasant 83 y.o. male who I last saw on 01/05/2017. We have been following his abdominal aortic aneurysm. At the time of his last visit, by CT angiogram, the aneurysm measured 4.6 cm in maximum diameter. I spent it we would not consider elective repair and a normal risk patient unless it reached 5.5 cm in maximum diameter. Based on his CT scan, I felt that he might potentially be a candidate for an endovascular repair if the aneurysm enlarged although the neck was fairly short. He comes in for a 6 month follow up visit.  Since I saw him last, he denies any abdominal pain or back pain. There have been no changes in his medical history. He is not a smoker. He is on aspirin and is on a statin.  He does complain of some leg swelling but this is chronic related to an old leg injury. His leg swelling is worse on the left side. This is aggravated by standing and relieved with elevation.  Past Medical History:  Diagnosis Date  . AAA (abdominal aortic aneurysm) [facility]) Sept. 2013  and  Feb. 2014   Infrarenal   . Allergy    Rhinitis, Hayfever  . Anemia   . Arthritis    Osteoarthritis  . Diabetes mellitus    Type II  . Edema, lower extremity    Left ankle trauma  . Hepatomegaly Sept. 2013   Fatty Liver  . Hx of epistaxis   . Hypertension   . Nocturia   . Nocturia   . Splenomegaly Sept. 2013   Benign splenic hemangioma  . Thyroid disease    Hypothyroidism, Chronic    Family History  Problem Relation Age of Onset  . Cancer Mother        Colon  . Stroke Mother   . Cancer Father   . Diabetes Father   . Cancer Brother        Prostate  . Heart disease Brother        After age 58  . AAA (abdominal aortic aneurysm) Brother   . Heart attack Brother   . Cancer Brother        Prostate  . Hypertension Brother   . Heart disease  Sister   . Heart attack Sister   . Hyperlipidemia Daughter   . Hypertension Daughter   . Cancer Daughter        Breast  . Hyperlipidemia Son   . Hypertension Son   . Cancer Brother   . Diabetes Brother        Glan    SOCIAL HISTORY: Social History  Substance Use Topics  . Smoking status: Former Smoker    Quit date: 06/03/2011  . Smokeless tobacco: Never Used  . Alcohol use No    No Known Allergies  Current Outpatient Prescriptions  Medication Sig Dispense Refill  . amLODipine (NORVASC) 10 MG tablet Take 10 mg by mouth daily.    Marland Kitchen aspirin 81 MG tablet Take 81 mg by mouth daily.    Marland Kitchen atorvastatin (LIPITOR) 10 MG tablet Take 10 mg by mouth daily.     . benazepril (LOTENSIN) 40 MG tablet Take 40 mg by mouth daily.    . clotrimazole-betamethasone (LOTRISONE) cream Apply topically 2 (two) times daily. Reported on 11/11/2015    . levothyroxine (SYNTHROID,  LEVOTHROID) 50 MCG tablet Take 50 mcg by mouth daily.    Marland Kitchen loratadine (CLARITIN) 10 MG tablet Take 10 mg by mouth 2 (two) times daily.     . metFORMIN (GLUCOPHAGE) 500 MG tablet Take 500 mg by mouth 2 (two) times daily with a meal.    . ONE TOUCH ULTRA TEST test strip     . Simethicone (GAS RELIEF 125 MAX ST PO) Take by mouth.    . TRUEPLUS LANCETS 33G MISC      No current facility-administered medications for this visit.     REVIEW OF SYSTEMS:   denotes positive finding,  denotes negative finding Cardiac  Comments:  Chest pain or chest pressure:    Shortness of breath upon exertion:    Short of breath when lying flat:    Irregular heart rhythm:        Vascular    Pain in calf, thigh, or hip brought on by ambulation:    Pain in feet at night that wakes you up from your sleep:     Blood clot in your veins:    Leg swelling:  X       Pulmonary    Oxygen at home:    Productive cough:     Wheezing:         Neurologic    Sudden weakness in arms or legs:     Sudden numbness in arms or legs:     Sudden onset of  difficulty speaking or slurred speech:    Temporary loss of vision in one eye:     Problems with dizziness:         Gastrointestinal    Blood in stool:     Vomited blood:         Genitourinary X Frequency   Burning when urinating:     Blood in urine:        Psychiatric    Major depression:         Hematologic    Bleeding problems:    Problems with blood clotting too easily:        Skin    Rashes or ulcers:        Constitutional    Fever or chills:     PHYSICAL EXAM:   Vitals:   07/13/17 0844  BP: (!) 147/78  Pulse: 66  Temp: (!) 97.3 F (36.3 C)  TempSrc: Oral  SpO2: 94%  Weight: 244 lb (110.7 kg)  Height: 6' (1.829 m)    GENERAL: The patient is a well-nourished male, in no acute distress. The vital signs are documented above. CARDIAC: There is a regular rate and rhythm.  VASCULAR: I do not detect carotid bruits. He has palpable femoral pulses. He has bilateral lower extremity swelling which is worse on the left side. PULMONARY: There is good air exchange bilaterally without wheezing or rales. ABDOMEN: Soft and non-tender with normal pitched bowel sounds. Because of his size his aneurysm is not palpable. MUSCULOSKELETAL: There are no major deformities or cyanosis. NEUROLOGIC: No focal weakness or paresthesias are detected. SKIN: There are no ulcers or rashes noted. PSYCHIATRIC: The patient has a normal affect.  DATA:    DUPLEX ABDOMINAL AORTIC ANEURYSM: I have independently interpreted the duplex of his abdominal aorta. The maximum diameter of his infrarenal aorta is 4.4 cm. The maximum diameter of the right common iliac artery is 1.8 cm. The maximum diameter of the left common iliac artery is 1.9 cm. There have been no  significant changes compared to the CT scan 6 months ago.  MEDICAL ISSUES:   4.4 CM INFRARENAL ABDOMINAL AORTIC ANEURYSM: There is been no significant change in the size of his infrarenal abdominal aortic aneurysm. He understands we would not  consider elective repair and was the aneurysm reached 5.5 cm in maximum diameter. 4 Shiley he is not a smoker. His blood pressure is under good control. I've encouraged him to stay as active as possible. I have ordered a follow up duplex in 6 months and I'll see him back at that time. He knows to call sooner if he has problems.  Waverly Ferrari [facility] [place] [phone]

## 2017-07-14 NOTE — Addendum Note (Signed)
Addended by: Burton Apley A on: 07/14/2017 04:29 PM   Modules accepted: Orders

## 2017-08-18 DIAGNOSIS — E039 Hypothyroidism, unspecified: Secondary | ICD-10-CM | POA: Diagnosis not present

## 2017-08-18 DIAGNOSIS — E7849 Other hyperlipidemia: Secondary | ICD-10-CM | POA: Diagnosis not present

## 2017-08-18 DIAGNOSIS — I714 Abdominal aortic aneurysm, without rupture: Secondary | ICD-10-CM | POA: Diagnosis not present

## 2017-08-18 DIAGNOSIS — R011 Cardiac murmur, unspecified: Secondary | ICD-10-CM | POA: Diagnosis not present

## 2017-08-18 DIAGNOSIS — M1991 Primary osteoarthritis, unspecified site: Secondary | ICD-10-CM | POA: Diagnosis not present

## 2017-08-18 DIAGNOSIS — E1143 Type 2 diabetes mellitus with diabetic autonomic (poly)neuropathy: Secondary | ICD-10-CM | POA: Diagnosis not present

## 2017-08-18 DIAGNOSIS — E7800 Pure hypercholesterolemia, unspecified: Secondary | ICD-10-CM | POA: Diagnosis not present

## 2017-08-18 DIAGNOSIS — I10 Essential (primary) hypertension: Secondary | ICD-10-CM | POA: Diagnosis not present

## 2017-08-18 DIAGNOSIS — I5032 Chronic diastolic (congestive) heart failure: Secondary | ICD-10-CM | POA: Diagnosis not present

## 2017-08-23 DIAGNOSIS — E039 Hypothyroidism, unspecified: Secondary | ICD-10-CM | POA: Diagnosis not present

## 2017-08-23 DIAGNOSIS — I10 Essential (primary) hypertension: Secondary | ICD-10-CM | POA: Diagnosis not present

## 2017-08-23 DIAGNOSIS — Z6833 Body mass index (BMI) 33.0-33.9, adult: Secondary | ICD-10-CM | POA: Diagnosis not present

## 2017-08-23 DIAGNOSIS — Z0001 Encounter for general adult medical examination with abnormal findings: Secondary | ICD-10-CM | POA: Diagnosis not present

## 2017-08-23 DIAGNOSIS — E870 Hyperosmolality and hypernatremia: Secondary | ICD-10-CM | POA: Diagnosis not present

## 2017-08-23 DIAGNOSIS — E1143 Type 2 diabetes mellitus with diabetic autonomic (poly)neuropathy: Secondary | ICD-10-CM | POA: Diagnosis not present

## 2017-08-23 DIAGNOSIS — R011 Cardiac murmur, unspecified: Secondary | ICD-10-CM | POA: Diagnosis not present

## 2017-08-23 DIAGNOSIS — E7849 Other hyperlipidemia: Secondary | ICD-10-CM | POA: Diagnosis not present

## 2017-11-11 DIAGNOSIS — I5032 Chronic diastolic (congestive) heart failure: Secondary | ICD-10-CM | POA: Diagnosis not present

## 2017-11-11 DIAGNOSIS — K5901 Slow transit constipation: Secondary | ICD-10-CM | POA: Diagnosis not present

## 2017-11-11 DIAGNOSIS — E1143 Type 2 diabetes mellitus with diabetic autonomic (poly)neuropathy: Secondary | ICD-10-CM | POA: Diagnosis not present

## 2017-11-11 DIAGNOSIS — I10 Essential (primary) hypertension: Secondary | ICD-10-CM | POA: Diagnosis not present

## 2017-11-11 DIAGNOSIS — Z6833 Body mass index (BMI) 33.0-33.9, adult: Secondary | ICD-10-CM | POA: Diagnosis not present

## 2018-01-11 ENCOUNTER — Ambulatory Visit (INDEPENDENT_AMBULATORY_CARE_PROVIDER_SITE_OTHER): Payer: Medicare HMO | Admitting: Vascular Surgery

## 2018-01-11 ENCOUNTER — Ambulatory Visit (HOSPITAL_COMMUNITY)
Admission: RE | Admit: 2018-01-11 | Discharge: 2018-01-11 | Disposition: A | Discharge status: Home / Self Care | Payer: Medicare HMO | Source: Ambulatory Visit | Attending: Vascular Surgery | Admitting: Vascular Surgery

## 2018-01-11 ENCOUNTER — Encounter: Payer: Self-pay | Admitting: Vascular Surgery

## 2018-01-11 VITALS — BP 133/74 | HR 73 | Resp 20 | Ht 72.0 in | Wt 243.0 lb

## 2018-01-11 DIAGNOSIS — Z87891 Personal history of nicotine dependence: Secondary | ICD-10-CM | POA: Diagnosis not present

## 2018-01-11 DIAGNOSIS — I714 Abdominal aortic aneurysm, without rupture, unspecified: Secondary | ICD-10-CM

## 2018-01-11 DIAGNOSIS — E669 Obesity, unspecified: Secondary | ICD-10-CM | POA: Diagnosis not present

## 2018-01-11 DIAGNOSIS — E119 Type 2 diabetes mellitus without complications: Secondary | ICD-10-CM | POA: Insufficient documentation

## 2018-01-11 NOTE — Progress Notes (Signed)
Patient name: Adrian Hester Napierala MRN: [id number] DOB: 01-May-1930 Sex: male  REASON FOR VISIT:   Follow-up of abdominal aortic aneurysm.  HPI:   Adrian Hester Landen is a pleasant 84 y.o. male who I last saw on 07/13/2017.  I have been following his abdominal aortic aneurysm.  Based on his previous CAT scan I felt that he might potentially be a candidate for an endovascular repair if the aneurysm enlarged significantly although the neck was fairly short.  At the time of his last visit, the maximum diameter of his aneurysm was 4.4 cm.  Since I saw him last, he denies any abdominal pain or back pain.  He does occasionally have some left hip pain which she attributes to arthritis.  He walks every day.  He apparently is a pretty decent pool player and is having fun playing pool with his friends.  He also sings at church.  Overall I think he is doing very well.  He is not a smoker and his blood pressure is been under good control.  Past Medical History:  Diagnosis Date  . AAA (abdominal aortic aneurysm) [facility](HCC) Sept. 2013  and  Feb. 2014   Infrarenal   . Allergy    Rhinitis, Hayfever  . Anemia   . Arthritis    Osteoarthritis  . Diabetes mellitus    Type II  . Edema, lower extremity    Left ankle trauma  . Hepatomegaly Sept. 2013   Fatty Liver  . Hx of epistaxis   . Hypertension   . Nocturia   . Nocturia   . Splenomegaly Sept. 2013   Benign splenic hemangioma  . Thyroid disease    Hypothyroidism, Chronic    Family History  Problem Relation Age of Onset  . Cancer Mother        Colon  . Stroke Mother   . Cancer Father   . Diabetes Father   . Cancer Brother        Prostate  . Heart disease Brother        After age 82  . AAA (abdominal aortic aneurysm) Brother   . Heart attack Brother   . Cancer Brother        Prostate  . Hypertension Brother   . Hyperlipidemia Daughter   . Hypertension Daughter   . Cancer Daughter        Breast  . Hyperlipidemia Son   . Hypertension Son   .  Cancer Brother   . Diabetes Brother        Glan  . Heart disease Sister   . Heart attack Sister     SOCIAL HISTORY: Social History   Tobacco Use  . Smoking status: Former Smoker    Last attempt to quit: 06/03/2011    Years since quitting: 6.6  . Smokeless tobacco: Never Used  Substance Use Topics  . Alcohol use: No    No Known Allergies  Current Outpatient Medications  Medication Sig Dispense Refill  . amLODipine (NORVASC) 10 MG tablet Take 10 mg by mouth daily.    Marland Kitchen. aspirin 81 MG tablet Take 81 mg by mouth daily.    Marland Kitchen. atorvastatin (LIPITOR) 10 MG tablet Take 10 mg by mouth daily.     . benazepril (LOTENSIN) 40 MG tablet Take 40 mg by mouth daily.    . clotrimazole-betamethasone (LOTRISONE) cream Apply topically 2 (two) times daily. Reported on 11/11/2015    . levothyroxine (SYNTHROID, LEVOTHROID) 50 MCG tablet Take 50 mcg by mouth daily.    .Marland Kitchen  loratadine (CLARITIN) 10 MG tablet Take 10 mg by mouth 2 (two) times daily.     . metFORMIN (GLUCOPHAGE) 500 MG tablet Take 500 mg by mouth 2 (two) times daily with a meal.    . ONE TOUCH ULTRA TEST test strip     . Simethicone (GAS RELIEF 125 MAX ST PO) Take by mouth.    . TRUEPLUS LANCETS 33G MISC      No current facility-administered medications for this visit.     REVIEW OF SYSTEMS:  [X]  denotes positive finding, [ ]  denotes negative finding Cardiac  Comments:  Chest pain or chest pressure:    Shortness of breath upon exertion:    Short of breath when lying flat:    Irregular heart rhythm:        Vascular    Pain in calf, thigh, or hip brought on by ambulation:    Pain in feet at night that wakes you up from your sleep:     Blood clot in your veins:    Leg swelling:         Pulmonary    Oxygen at home:    Productive cough:     Wheezing:         Neurologic    Sudden weakness in arms or legs:     Sudden numbness in arms or legs:     Sudden onset of difficulty speaking or slurred speech:    Temporary loss of vision in  one eye:     Problems with dizziness:         Gastrointestinal    Blood in stool:     Vomited blood:         Genitourinary    Burning when urinating:     Blood in urine:        Psychiatric    Major depression:         Hematologic    Bleeding problems:    Problems with blood clotting too easily:        Skin    Rashes or ulcers:        Constitutional    Fever or chills:     PHYSICAL EXAM:   Vitals:   01/11/18 0839  BP: 133/74  Pulse: 73  Resp: 20  SpO2: 97%  Weight: 243 lb (110.2 kg)  Height: 6' (1.829 m)    GENERAL: The patient is a well-nourished male, in no acute distress. The vital signs are documented above. CARDIAC: There is a regular rate and rhythm.  VASCULAR: I do not detect carotid bruits. He has palpable femoral and dorsalis pedis pulses bilaterally. PULMONARY: There is good air exchange bilaterally without wheezing or rales. ABDOMEN: Soft and non-tender with normal pitched bowel sounds.  MUSCULOSKELETAL: There are no major deformities or cyanosis. NEUROLOGIC: No focal weakness or paresthesias are detected. SKIN: There are no ulcers or rashes noted. PSYCHIATRIC: The patient has a normal affect.  DATA:    DUPLEX ABDOMINAL AORTA: I have independently interpreted the duplex of his abdominal aorta.  The maximum diameter of the aneurysm is 4.5 cm.  Thus this is not changed in the last 6 months.  Visualization of the iliac arteries was somewhat difficult.  The left common iliac artery could be visualized and measured 1.5 cm in diameter thus this is not changed in size either.  MEDICAL ISSUES:   ASYMPTOMATIC 4.5 CM INFRARENAL ABDOMINAL AORTIC ANEURYSM: The aneurysm is not changed in size.  I explained we would not consider  elective repair unless the aneurysm reached 5.5 cm in greatest diameter in a normal risk patient.  If the aneurysm does enlarge he would need a follow-up CT scan in order to determine if he was a candidate for endovascular approach.  Based on  her previous CT scan the neck was somewhat short.  Fortunately however the aneurysm has remained stable in size.  Given the size of the aneurysm I do think we need to continue follow-up intervals at 6 months.  I have ordered a follow-up duplex scan at that time.  Of note his brother did apparently died from a ruptured abdominal aortic aneurysm so I do think we need to continue to follow this very closely.  I will see him back in 6 months.  He knows to call sooner if he has problems.  Waverly Ferrari [facility] [phone]

## 2018-02-17 DIAGNOSIS — E1143 Type 2 diabetes mellitus with diabetic autonomic (poly)neuropathy: Secondary | ICD-10-CM | POA: Diagnosis not present

## 2018-02-17 DIAGNOSIS — E7849 Other hyperlipidemia: Secondary | ICD-10-CM | POA: Diagnosis not present

## 2018-02-17 DIAGNOSIS — E039 Hypothyroidism, unspecified: Secondary | ICD-10-CM | POA: Diagnosis not present

## 2018-02-17 DIAGNOSIS — Z0001 Encounter for general adult medical examination with abnormal findings: Secondary | ICD-10-CM | POA: Diagnosis not present

## 2018-02-17 DIAGNOSIS — I10 Essential (primary) hypertension: Secondary | ICD-10-CM | POA: Diagnosis not present

## 2018-02-21 DIAGNOSIS — Z6832 Body mass index (BMI) 32.0-32.9, adult: Secondary | ICD-10-CM | POA: Diagnosis not present

## 2018-02-21 DIAGNOSIS — I5032 Chronic diastolic (congestive) heart failure: Secondary | ICD-10-CM | POA: Diagnosis not present

## 2018-02-21 DIAGNOSIS — I714 Abdominal aortic aneurysm, without rupture: Secondary | ICD-10-CM | POA: Diagnosis not present

## 2018-02-21 DIAGNOSIS — E870 Hyperosmolality and hypernatremia: Secondary | ICD-10-CM | POA: Diagnosis not present

## 2018-02-21 DIAGNOSIS — E7849 Other hyperlipidemia: Secondary | ICD-10-CM | POA: Diagnosis not present

## 2018-02-21 DIAGNOSIS — I517 Cardiomegaly: Secondary | ICD-10-CM | POA: Diagnosis not present

## 2018-02-21 DIAGNOSIS — I10 Essential (primary) hypertension: Secondary | ICD-10-CM | POA: Diagnosis not present

## 2018-02-21 DIAGNOSIS — E1143 Type 2 diabetes mellitus with diabetic autonomic (poly)neuropathy: Secondary | ICD-10-CM | POA: Diagnosis not present

## 2018-06-14 ENCOUNTER — Other Ambulatory Visit: Payer: Self-pay

## 2018-06-14 DIAGNOSIS — I714 Abdominal aortic aneurysm, without rupture, unspecified: Secondary | ICD-10-CM

## 2018-07-05 ENCOUNTER — Other Ambulatory Visit: Payer: Self-pay

## 2018-07-05 ENCOUNTER — Ambulatory Visit (HOSPITAL_COMMUNITY)
Admission: RE | Admit: 2018-07-05 | Discharge: 2018-07-05 | Disposition: A | Discharge status: Home / Self Care | Payer: Medicare HMO | Source: Ambulatory Visit | Attending: Vascular Surgery | Admitting: Vascular Surgery

## 2018-07-05 ENCOUNTER — Ambulatory Visit: Payer: Medicare HMO | Admitting: Vascular Surgery

## 2018-07-05 ENCOUNTER — Ambulatory Visit (INDEPENDENT_AMBULATORY_CARE_PROVIDER_SITE_OTHER): Payer: Medicare HMO | Admitting: Vascular Surgery

## 2018-07-05 ENCOUNTER — Encounter: Payer: Self-pay | Admitting: Vascular Surgery

## 2018-07-05 ENCOUNTER — Other Ambulatory Visit (HOSPITAL_COMMUNITY): Payer: Medicare HMO

## 2018-07-05 VITALS — BP 126/67 | HR 48 | Temp 97.9°F | Resp 16 | Ht 72.0 in | Wt 246.0 lb

## 2018-07-05 DIAGNOSIS — I714 Abdominal aortic aneurysm, without rupture, unspecified: Secondary | ICD-10-CM
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
## 2018-08-04 ENCOUNTER — Ambulatory Visit: Payer: Medicare HMO | Admitting: Cardiovascular Disease

## 2018-08-04 ENCOUNTER — Encounter: Payer: Self-pay | Admitting: *Deleted

## 2018-08-04 ENCOUNTER — Encounter: Payer: Self-pay | Admitting: Cardiovascular Disease

## 2018-08-04 ENCOUNTER — Telehealth: Payer: Self-pay | Admitting: Cardiovascular Disease

## 2018-08-04 VITALS — BP 154/81 | HR 80 | Ht 72.0 in | Wt 247.0 lb

## 2018-08-04 DIAGNOSIS — I714 Abdominal aortic aneurysm, without rupture, unspecified: Secondary | ICD-10-CM

## 2018-08-04 DIAGNOSIS — I10 Essential (primary) hypertension: Secondary | ICD-10-CM

## 2018-08-04 DIAGNOSIS — R0609 Other forms of dyspnea: Secondary | ICD-10-CM

## 2018-08-04 DIAGNOSIS — R079 Chest pain, unspecified: Secondary | ICD-10-CM

## 2018-08-04 DIAGNOSIS — R011 Cardiac murmur, unspecified: Secondary | ICD-10-CM | POA: Diagnosis not present

## 2018-08-04 DIAGNOSIS — E785 Hyperlipidemia, unspecified: Secondary | ICD-10-CM
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
## 2018-08-04 NOTE — Telephone Encounter (Signed)
°  Precert needed for:  Lexiscan & Echo   Location: [facility]    Date: Aug 11, 2018 Arrive at 10:00

## 2018-08-04 NOTE — Patient Instructions (Signed)
Your physician recommends that you schedule a follow-up appointment in: 2 MONTHS OR NEXT AVAILABLE   Your physician recommends that you continue on your current medications as directed. Please refer to the Current Medication list given to you today.  Your physician has requested that you have an echocardiogram. Echocardiography is a painless test that uses sound waves to create images of your heart. It provides your doctor with information about the size and shape of your heart and how well your heart's chambers and valves are working. This procedure takes approximately one hour. There are no restrictions for this procedure.   Your physician has requested that you have a lexiscan myoview. For further information please visit [email or web address] Please follow instruction sheet, as given.  Thank you for choosing [facility]!!

## 2018-08-11 ENCOUNTER — Ambulatory Visit (HOSPITAL_COMMUNITY)
Admission: RE | Admit: 2018-08-11 | Discharge: 2018-08-11 | Disposition: A | Discharge status: Home / Self Care | Payer: Medicare HMO | Source: Ambulatory Visit | Attending: Cardiovascular Disease | Admitting: Cardiovascular Disease

## 2018-08-11 ENCOUNTER — Encounter (HOSPITAL_COMMUNITY)
Admission: RE | Admit: 2018-08-11 | Discharge: 2018-08-11 | Disposition: A | Discharge status: Home / Self Care | Payer: Medicare HMO | Source: Ambulatory Visit | Attending: Cardiovascular Disease | Admitting: Cardiovascular Disease

## 2018-08-11 DIAGNOSIS — R011 Cardiac murmur, unspecified: Secondary | ICD-10-CM | POA: Insufficient documentation

## 2018-08-11 DIAGNOSIS — R079 Chest pain, unspecified: Secondary | ICD-10-CM | POA: Insufficient documentation

## 2018-08-11 DIAGNOSIS — E119 Type 2 diabetes mellitus without complications: Secondary | ICD-10-CM | POA: Insufficient documentation

## 2018-08-11 DIAGNOSIS — I700 Atherosclerosis of aorta: Secondary | ICD-10-CM | POA: Diagnosis not present

## 2018-08-11 DIAGNOSIS — I714 Abdominal aortic aneurysm, without rupture: Secondary | ICD-10-CM | POA: Insufficient documentation

## 2018-08-11 DIAGNOSIS — I10 Essential (primary) hypertension: Secondary | ICD-10-CM | POA: Insufficient documentation

## 2018-08-11 LAB — NM MYOCAR MULTI W/SPECT W/WALL MOTION / EF
CHL CUP NUCLEAR SDS: 5
CHL CUP RESTING HR STRESS: 77 {beats}/min
CSEPPHR: 86 {beats}/min
LHR: 0.41
LV dias vol: 185 mL (ref 62–150)
LVSYSVOL: 136 mL
NUC STRESS TID: 1.24
SRS: 5
SSS: 10

## 2018-08-11 MED ORDER — TECHNETIUM TC 99M TETROFOSMIN IV KIT
30.0000 | PACK | Freq: Once | INTRAVENOUS | Status: AC | PRN
  Administered 2018-08-11: 30 via INTRAVENOUS

## 2018-08-11 MED ORDER — REGADENOSON 0.4 MG/5ML IV SOLN
INTRAVENOUS | Status: AC
  Administered 2018-08-11: 0.4 mg via INTRAVENOUS
  Filled 2018-08-11: qty 5

## 2018-08-11 MED ORDER — TECHNETIUM TC 99M TETROFOSMIN IV KIT
10.0000 | PACK | Freq: Once | INTRAVENOUS | Status: AC | PRN
  Administered 2018-08-11: 10.2 via INTRAVENOUS

## 2018-08-11 MED ORDER — SODIUM CHLORIDE 0.9% FLUSH
INTRAVENOUS | Status: AC
  Administered 2018-08-11: 10 mL via INTRAVENOUS
  Filled 2018-08-11: qty 10

## 2018-08-11 NOTE — Progress Notes (Signed)
*  PRELIMINARY RESULTS* Echocardiogram 2D Echocardiogram has been performed.  Jeryl Columbia 08/11/2018, 1:58 PM

## 2018-08-18 ENCOUNTER — Telehealth: Payer: Self-pay | Admitting: *Deleted

## 2018-08-18 MED ORDER — ATORVASTATIN CALCIUM 40 MG PO TABS: 40.0000 mg | ORAL_TABLET | Freq: Every day | ORAL | 3 refills | Status: AC

## 2018-08-18 MED ORDER — METOPROLOL TARTRATE 25 MG PO TABS: 25.0000 mg | ORAL_TABLET | Freq: Two times a day (BID) | ORAL | 3 refills | Status: DC

## 2018-08-18 NOTE — Telephone Encounter (Signed)
Notes recorded by Lesle Chris, LPN on 16/10/958 at 4:12 PM EDT Patient notified. Copy to pmd. Follow up scheduled for 10/20/18 with Dr. Purvis Sheffield in [place] office. Stated that he sees his pmd on 08/30/2018 (Burdine). He agrees to medication changes. New meds sent to [facility] at patient request. ------  Notes recorded by Laqueta Linden, MD on 08/16/2018 at 4:46 PM EDT This study demonstrates: There is suggestion of blockages. Medication changes / Follow up studies / Other recommendations:  Start Lopressor 25 mg twice daily and increase atorvastatin to 40 mg daily. Please send results to the PCP: Burdine, Ananias Pilgrim, MD  Prentice Docker, MD 08/16/2018 4:45 PM

## 2018-08-21 ENCOUNTER — Telehealth: Payer: Self-pay | Admitting: Cardiovascular Disease

## 2018-08-21 ENCOUNTER — Encounter: Payer: Self-pay | Admitting: Cardiovascular Disease

## 2018-08-21 NOTE — Telephone Encounter (Signed)
Stress test result and medication changes given to daughter in law Bradford. Verbalized understanding.

## 2018-08-21 NOTE — Telephone Encounter (Signed)
Adrian Hester called asking to be told results from test.   Stated her father-n-law is confused about results and what to do with medication

## 2018-08-22 ENCOUNTER — Telehealth: Payer: Self-pay | Admitting: *Deleted

## 2018-08-22 NOTE — Telephone Encounter (Signed)
Patient informed and verbalized understanding of plan. Copy sent to PCP 

## 2018-08-22 NOTE — Telephone Encounter (Signed)
-----   Message from Laqueta Linden, MD sent at 08/21/2018  4:39 PM EST ----- This study demonstrates: Pumping function was on the low and abnormal.  There is mild aortic valve narrowing due to calcium buildup. Medication changes / Follow up studies / Other recommendations:   Medication recommendations as previously discussed when stress test findings were resulted. Please send results to the PCP:  Burdine, Ananias Pilgrim, MD  Prentice Docker, MD 08/21/2018 4:37 PM

## 2018-10-20 ENCOUNTER — Encounter

## 2018-10-20 ENCOUNTER — Encounter: Payer: Self-pay | Admitting: Cardiovascular Disease

## 2018-10-20 ENCOUNTER — Ambulatory Visit: Payer: Medicare HMO | Admitting: Cardiovascular Disease

## 2018-10-20 VITALS — BP 135/66 | HR 66 | Ht 72.0 in | Wt 243.8 lb

## 2018-10-20 DIAGNOSIS — I25118 Atherosclerotic heart disease of native coronary artery with other forms of angina pectoris: Secondary | ICD-10-CM

## 2018-10-20 DIAGNOSIS — R079 Chest pain, unspecified: Secondary | ICD-10-CM | POA: Diagnosis not present

## 2018-10-20 DIAGNOSIS — R9439 Abnormal result of other cardiovascular function study: Secondary | ICD-10-CM

## 2018-10-20 DIAGNOSIS — I714 Abdominal aortic aneurysm, without rupture, unspecified: Secondary | ICD-10-CM

## 2018-10-20 DIAGNOSIS — E785 Hyperlipidemia, unspecified: Secondary | ICD-10-CM

## 2018-10-20 DIAGNOSIS — I10 Essential (primary) hypertension: Secondary | ICD-10-CM

## 2018-10-20 DIAGNOSIS — R0609 Other forms of dyspnea: Secondary | ICD-10-CM | POA: Diagnosis not present
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
## 2018-10-20 NOTE — Patient Instructions (Signed)
Medication Instructions:  Continue all current medications.  Labwork: none  Testing/Procedures: none  Follow-Up: 3 months   Any Other Special Instructions Will Be Listed Below (If Applicable).  If you need a refill on your cardiac medications before your next appointment, please call your pharmacy.  

## 2018-12-21 ENCOUNTER — Other Ambulatory Visit: Payer: Self-pay

## 2018-12-21 DIAGNOSIS — I714 Abdominal aortic aneurysm, without rupture, unspecified: Secondary | ICD-10-CM

## 2018-12-27 ENCOUNTER — Ambulatory Visit (INDEPENDENT_AMBULATORY_CARE_PROVIDER_SITE_OTHER): Payer: Medicare HMO | Admitting: Vascular Surgery

## 2018-12-27 ENCOUNTER — Encounter: Payer: Self-pay | Admitting: Vascular Surgery

## 2018-12-27 ENCOUNTER — Other Ambulatory Visit: Payer: Self-pay | Admitting: *Deleted

## 2018-12-27 ENCOUNTER — Other Ambulatory Visit: Payer: Self-pay

## 2018-12-27 ENCOUNTER — Telehealth: Payer: Self-pay | Admitting: Cardiology

## 2018-12-27 ENCOUNTER — Ambulatory Visit (HOSPITAL_COMMUNITY)
Admission: RE | Admit: 2018-12-27 | Discharge: 2018-12-27 | Disposition: A | Discharge status: Home / Self Care | Payer: Medicare HMO | Source: Ambulatory Visit | Attending: Vascular Surgery | Admitting: Vascular Surgery

## 2018-12-27 VITALS — BP 137/81 | HR 63 | Resp 18 | Ht 73.0 in | Wt 239.6 lb

## 2018-12-27 DIAGNOSIS — I714 Abdominal aortic aneurysm, without rupture, unspecified: Secondary | ICD-10-CM

## 2018-12-27 DIAGNOSIS — I713 Abdominal aortic aneurysm, ruptured, unspecified: Secondary | ICD-10-CM

## 2018-12-27 NOTE — Telephone Encounter (Signed)
   Primary Cardiologist:Suresh Purvis Sheffield, MD  Chart reviewed as part of pre-operative protocol coverage. Because of Treavon Salters Sky's past medical history and time since last visit, he/she will require a follow-up visit in order to better assess preoperative cardiovascular risk.  Discussed with Dr. Darl Householder pt should be seen, please arrange in [place] or [place] if possible but if not I could see Thursday afternoon here at [facility] street or any APP in [place] on day convenient for pt.     Pre-op covering staff: - Please schedule appointment and call patient to inform them. - Please contact requesting surgeon's office via preferred method (i.e, phone, fax) to inform them of need for appointment prior to surgery.  If applicable, this message will also be routed to pharmacy pool and/or primary cardiologist for input on holding anticoagulant/antiplatelet agent as requested below so that this information is available at time of patient's appointment.   Nada Boozer, NP  12/27/2018, 11:16 AM
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
## 2018-12-27 NOTE — Progress Notes (Signed)
Patient name: Adrian Hester Justus MRN: [id number] DOB: 1930/05/02 Sex: male  REASON FOR VISIT:   Follow-up of abdominal aortic aneurysm.  HPI:   Adrian Hester Baity is a pleasant 85 y.o. male who I last saw on 07/05/2018.  At that time he had a 4.9 cm infrarenal abdominal aortic aneurysm.  He did not appear to be a good candidate for endovascular approach if the aneurysm continues to enlarge.  The neck was only about 12 mm.  I ordered a six-month follow-up duplex.  He comes in for that visit.  Since I saw him last he denies any abdominal pain or back pain.  He did fall and injured his right knee and has had some right knee pain but this is improving.  There have been no significant changes to his medical history.  He does have a family history of aneurysmal disease.  His brother died from a ruptured abdominal aortic aneurysm.  He quit tobacco 15 years ago.  He has a history of arrhythmias and his previously been seen by Dr. Purvis SheffieldKoneswaran.  He denies any previous history of myocardial infarction or congestive heart failure.  He denies any recent chest pain although he does admit to dyspnea on exertion and orthopnea.  He does have diabetes, hypertension, hypercholesterolemia, and obesity.  Past Medical History:  Diagnosis Date  . AAA (abdominal aortic aneurysm) [facility](HCC) Sept. 2013  and  Feb. 2014   Infrarenal   . Allergy    Rhinitis, Hayfever  . Anemia   . Arthritis    Osteoarthritis  . Diabetes mellitus    Type II  . Edema, lower extremity    Left ankle trauma  . Hepatomegaly Sept. 2013   Fatty Liver  . Hx of epistaxis   . Hypertension   . Nocturia   . Nocturia   . Splenomegaly Sept. 2013   Benign splenic hemangioma  . Thyroid disease    Hypothyroidism, Chronic    Family History  Problem Relation Age of Onset  . Cancer Mother        Colon  . Stroke Mother   . Cancer Father   . Diabetes Father   . Cancer Brother        Prostate  . Heart disease Brother        After age 83  .  AAA (abdominal aortic aneurysm) Brother   . Heart attack Brother   . Cancer Brother        Prostate  . Hypertension Brother   . Hyperlipidemia Daughter   . Hypertension Daughter   . Cancer Daughter        Breast  . Hyperlipidemia Son   . Hypertension Son   . Cancer Brother   . Diabetes Brother        Glan  . Heart disease Sister   . Heart attack Sister     SOCIAL HISTORY: Social History   Tobacco Use  . Smoking status: Former Smoker    Last attempt to quit: 06/03/2011    Years since quitting: 7.5  . Smokeless tobacco: Never Used  Substance Use Topics  . Alcohol use: No    No Known Allergies  Current Outpatient Medications  Medication Sig Dispense Refill  . amLODipine (NORVASC) 10 MG tablet Take 10 mg by mouth daily.    Marland Kitchen. aspirin 81 MG tablet Take 81 mg by mouth daily.    Marland Kitchen. atorvastatin (LIPITOR) 40 MG tablet Take 1 tablet (40 mg total) by mouth daily. 90 tablet 3  .  benazepril (LOTENSIN) 40 MG tablet Take 40 mg by mouth daily.    Marland Kitchen levothyroxine (SYNTHROID, LEVOTHROID) 75 MCG tablet Take 1 tablet by mouth daily.    . metFORMIN (GLUCOPHAGE) 500 MG tablet Take 500 mg by mouth 2 (two) times daily with a meal.    . ONE TOUCH ULTRA TEST test strip     . TRUEPLUS LANCETS 33G MISC     . metoprolol tartrate (LOPRESSOR) 25 MG tablet Take 1 tablet (25 mg total) by mouth 2 (two) times daily. 180 tablet 3   No current facility-administered medications for this visit.     REVIEW OF SYSTEMS:  [X]  denotes positive finding, [ ]  denotes negative finding Cardiac  Comments:  Chest pain or chest pressure:    Shortness of breath upon exertion: x   Short of breath when lying flat: x   Irregular heart rhythm:        Vascular    Pain in calf, thigh, or hip brought on by ambulation:    Pain in feet at night that wakes you up from your sleep:     Blood clot in your veins:    Leg swelling:         Pulmonary    Oxygen at home:    Productive cough:     Wheezing:         Neurologic     Sudden weakness in arms or legs:     Sudden numbness in arms or legs:     Sudden onset of difficulty speaking or slurred speech:    Temporary loss of vision in one eye:     Problems with dizziness:         Gastrointestinal    Blood in stool:     Vomited blood:         Genitourinary    Burning when urinating:     Blood in urine:        Psychiatric    Major depression:         Hematologic    Bleeding problems:    Problems with blood clotting too easily:        Skin    Rashes or ulcers:        Constitutional    Fever or chills:     PHYSICAL EXAM:   Vitals:   12/27/18 0854  BP: 137/81  Pulse: 63  Resp: 18  SpO2: 96%  Weight: 239 lb 9.6 oz (108.7 kg)  Height: 6\' 1"  (1.854 m)   Body mass index is 31.61 kg/m.   GENERAL: The patient is a well-nourished male, in no acute distress. The vital signs are documented above. CARDIAC: There is a regular rate and rhythm.  VASCULAR: I do not detect carotid bruits. He has palpable femoral and popliteal pulses.  I cannot palpate pedal pulses. He has mild bilateral lower extremity swelling. PULMONARY: There is good air exchange bilaterally without wheezing or rales. ABDOMEN: Soft and non-tender with normal pitched bowel sounds.  Because of his size I cannot palpate his abdominal aortic aneurysm. MUSCULOSKELETAL: There are no major deformities or cyanosis. NEUROLOGIC: No focal weakness or paresthesias are detected. SKIN: There are no ulcers or rashes noted. PSYCHIATRIC: The patient has a normal affect.  DATA:    DUPLEX ABDOMINAL AORTA: I have independently interpreted his duplex of the abdominal aorta today.  The maximum diameter of the aneurysm is noted to be 5.7 cm.  His right common iliac artery measures 2 cm in maximum diameter.  The left common iliac artery measures 2.4 cm in maximum diameter.  MEDICAL ISSUES:   ABDOMINAL AORTIC ANEURYSM: This patient has a 5.7 cm infrarenal abdominal aortic aneurysm.  This has enlarged  and I think we need to consider elective repair despite his age.  I have ordered a CT angiogram of the abdomen and pelvis to further assess the aneurysm to determine if he might be a candidate for an endovascular repair.  Based on an old CAT scan in 2018 it looks like the neck might be short.  In addition we will obtain preoperative cardiac clearance as I think he would be at increased risk given his age and cardiac history.  I cannot palpate pedal pulses so I will obtain ABIs when he comes in for his follow-up visit.  Once we have all this information we can consider elective repair of his aneurysm.   CARDIAC: The patient does have a history of chest pain and exertional dyspnea.  I am unable to locate a recent myocardial perfusion study.  He did have an echo on 08/11/2018 which shows mild left ventricular hypertrophy.  Ejection fraction was estimated at 50%.  He had mildly calcified annulus of the aortic valve with a mild stenosis.  He is on aspirin and is on a statin.  Waverly Ferrari [facility] [phone]

## 2018-12-27 NOTE — Telephone Encounter (Signed)
Faxed over clearance notes to requesting surgeon's office. 

## 2018-12-27 NOTE — Telephone Encounter (Signed)
Spoke with pt to make an appt. Pt has appt with Nada Boozer, NP on 3/12 at 9 am. Pt verbalized understanding and thanked me for the call.

## 2018-12-28 ENCOUNTER — Telehealth: Payer: Self-pay

## 2018-12-28 ENCOUNTER — Encounter: Payer: Self-pay | Admitting: *Deleted

## 2018-12-28 ENCOUNTER — Other Ambulatory Visit: Payer: Self-pay

## 2018-12-28 ENCOUNTER — Encounter: Payer: Self-pay | Admitting: Cardiology

## 2018-12-28 ENCOUNTER — Ambulatory Visit: Payer: Medicare HMO | Admitting: Cardiology

## 2018-12-28 VITALS — BP 118/50 | HR 69 | Ht 72.0 in | Wt 237.0 lb

## 2018-12-28 DIAGNOSIS — E785 Hyperlipidemia, unspecified: Secondary | ICD-10-CM

## 2018-12-28 DIAGNOSIS — I714 Abdominal aortic aneurysm, without rupture, unspecified: Secondary | ICD-10-CM

## 2018-12-28 DIAGNOSIS — I493 Ventricular premature depolarization: Secondary | ICD-10-CM

## 2018-12-28 DIAGNOSIS — R0609 Other forms of dyspnea: Secondary | ICD-10-CM | POA: Diagnosis not present

## 2018-12-28 DIAGNOSIS — R9439 Abnormal result of other cardiovascular function study: Secondary | ICD-10-CM | POA: Diagnosis not present

## 2018-12-28 DIAGNOSIS — Z01818 Encounter for other preprocedural examination: Secondary | ICD-10-CM | POA: Diagnosis not present

## 2018-12-28 LAB — BASIC METABOLIC PANEL
BUN/Creatinine Ratio: 24 (ref 10–24)
BUN: 30 mg/dL — ABNORMAL HIGH (ref 8–27)
CALCIUM: 9.8 mg/dL (ref 8.6–10.2)
CO2: 20 mmol/L (ref 20–29)
Chloride: 103 mmol/L (ref 96–106)
Creatinine, Ser: 1.24 mg/dL (ref 0.76–1.27)
GFR calc non Af Amer: 52 mL/min/{1.73_m2} — ABNORMAL LOW (ref >59)
GFR, EST AFRICAN AMERICAN: 60 mL/min/{1.73_m2} (ref >59)
Glucose: 127 mg/dL — ABNORMAL HIGH (ref 65–99)
POTASSIUM: 4.8 mmol/L (ref 3.5–5.2)
Sodium: 141 mmol/L (ref 134–144)

## 2018-12-28 LAB — CBC
HEMOGLOBIN: 12.9 g/dL — AB (ref 13.0–17.7)
Hematocrit: 38.9 % (ref 37.5–51.0)
MCH: 29.1 pg (ref 26.6–33.0)
MCHC: 33.2 g/dL (ref 31.5–35.7)
MCV: 88 fL (ref 79–97)
Platelets: 171 10*3/uL (ref 150–450)
RBC: 4.44 x10E6/uL (ref 4.14–5.80)
RDW: 14.1 % (ref 11.6–15.4)
WBC: 12 10*3/uL — AB (ref 3.4–10.8)

## 2018-12-28 NOTE — Telephone Encounter (Signed)
Spoke with Adrian Hester and she verbalized understanding of pt need for a heart cath prior to AAA repair surgery clearance and will call Dr. Leandrew Koyanagi (PMD) and try to get him in soon prior to his heart cath 01/08/19.

## 2018-12-28 NOTE — Telephone Encounter (Signed)
-----   Message from Laura R Ingold, NP sent at 12/28/2018  3:53 PM EDT ----- Pt was not ill when he came in.  Kidney function is good.  Have pt see his PCP for elevated WBC and maybe urinalysis, lives in [place] so he will not want to return to [place]. 

## 2018-12-28 NOTE — Patient Instructions (Addendum)
Medication Instructions:   Your physician recommends that you continue on your current medications as directed. Please refer to the Current Medication list given to you today.   If you need a refill on your cardiac medications before your next appointment, please call your pharmacy.   Lab work: CBC AND BMET TODAY   If you have labs (blood work) drawn today and your tests are completely normal, you will receive your results only by: Marland Kitchen MyChart Message (if you have MyChart) OR . A paper copy in the mail If you have any lab test that is abnormal or we need to change your treatment, we will call you to review the results.  Testing/Procedures: SEE ATTACHED LETTER  Your physician has requested that you have a cardiac catheterization. Cardiac catheterization is used to diagnose and/or treat various heart conditions. Doctors may recommend this procedure for a number of different reasons. The most common reason is to evaluate chest pain. Chest pain can be a symptom of coronary artery disease (CAD), and cardiac catheterization can show whether plaque is narrowing or blocking your heart's arteries. This procedure is also used to evaluate the valves, as well as measure the blood flow and oxygen levels in different parts of your heart. For further information please visit [email or web address] Please follow instruction sheet, as given.   Follow-Up:  AS SCHEDULED ALREADY   Any Other Special Instructions Will Be Listed Below (If Applicable).

## 2018-12-28 NOTE — Addendum Note (Signed)
Addended by: Leone Brand on: 12/28/2018 10:56 AM   Modules accepted: Orders

## 2018-12-28 NOTE — Telephone Encounter (Deleted)
-----   Message from Leone Brand, NP sent at 12/28/2018  3:53 PM EDT ----- Pt was not ill when he came in.  Kidney function is good.  Have pt see his PCP for elevated WBC and maybe urinalysis, lives in [place] so he will not want to return to [place].

## 2018-12-28 NOTE — Telephone Encounter (Signed)
Called and spoke with the pt RE: his CBC and he requested that I call back in a little while and talk with his Caregiver/ daughter in law.[name] on the DPR and explain his lab results to her.

## 2019-01-03 ENCOUNTER — Other Ambulatory Visit (HOSPITAL_COMMUNITY): Payer: Medicare HMO

## 2019-01-03 ENCOUNTER — Ambulatory Visit
Admission: RE | Admit: 2019-01-03 | Discharge: 2019-01-03 | Disposition: A | Discharge status: Home / Self Care | Payer: Medicare HMO | Source: Ambulatory Visit | Attending: Vascular Surgery | Admitting: Vascular Surgery

## 2019-01-03 ENCOUNTER — Other Ambulatory Visit: Payer: Self-pay

## 2019-01-03 ENCOUNTER — Ambulatory Visit: Payer: Medicare HMO | Admitting: Vascular Surgery

## 2019-01-03 DIAGNOSIS — I713 Abdominal aortic aneurysm, ruptured, unspecified: Secondary | ICD-10-CM

## 2019-01-03 MED ORDER — IOPAMIDOL (ISOVUE-370) INJECTION 76%
75.0000 mL | Freq: Once | INTRAVENOUS | Status: AC | PRN
  Administered 2019-01-03: 75 mL via INTRAVENOUS

## 2019-01-04 ENCOUNTER — Telehealth: Payer: Self-pay | Admitting: *Deleted

## 2019-01-04 NOTE — Telephone Encounter (Addendum)
Pt contacted pre-catheterization scheduled at [facility] for: Monday January 08, 2019 7:30 AM  I spoke with patient's daughter in law, [phone]. [name] states patient is concerned about Covid 36 pandemic/risk to patient if he has cardiac cath on Monday, they prefer to cancel at this time. [name] is aware that cardiac cath was scheduled to clear pt for AAA repair.  At patient's request, I have cancelled cardiac cath for Monday January 08, 2019, pt did not want to reschedule at this time, wanted to wait to see how Covid 19 situation was before rescheduling. [name] is aware that I will follow up with her the first of next week, see how Covid 19 situation is at that time, and come up with a plan for rescheduling cath. [name] was comfortable with this plan, thanked me for the call.  Dr Purvis Sheffield is aware of and agrees with this plan.

## 2019-01-08 ENCOUNTER — Encounter (HOSPITAL_COMMUNITY): Admission: RE | Payer: Self-pay | Source: Home / Self Care

## 2019-01-08 ENCOUNTER — Ambulatory Visit (HOSPITAL_COMMUNITY): Admission: RE | Admit: 2019-01-08 | Payer: Medicare HMO | Source: Home / Self Care | Admitting: Cardiology

## 2019-01-08 SURGERY — LEFT HEART CATH AND CORONARY ANGIOGRAPHY
Anesthesia: LOCAL

## 2019-01-09 NOTE — Telephone Encounter (Signed)
LMTCB to discuss rescheduling cath. 

## 2019-01-09 NOTE — Telephone Encounter (Signed)
LMTCB for Adrian Hester to discuss rescheduling cardiac cath.

## 2019-01-09 NOTE — Telephone Encounter (Signed)
I spoke with Adrian Hester.

## 2019-01-09 NOTE — Telephone Encounter (Signed)
Adrian Hester states she called Adrian Hester office to let them know cardiac cath had been cancelled,spoke with Adrian Hester nurse , and since cardiac cath had not been done yet, appt with Adrian Hester was cancelled. Adrian Hester states her understanding is that patient was told at previous appt with Adrian Hester that he did need to have surgery,  did not know results of CT abdomen done 01/03/19. I asked Adrian Hester to contact Adrian Hester office to get Adrian Hester recommendation about the urgency of surgery and  cardiac cath could be rescheduled  according to his recommendation about the timing of surgery. Adrian Hester is asking if patient should have cardiac cath even if he does not have AAA repair surgery. Adrian Hester states patient's cardiac symptoms have not changed since 12/28/18 office visit with Adrian Hester.  Adrian Hester advised I will forward to Adrian Boozer, NP for review/recommendations then will follow up with her.

## 2019-01-09 NOTE — Telephone Encounter (Signed)
I agree, will leave to Dr. Edilia Bo if surgery will be done and timing.

## 2019-01-10 NOTE — Telephone Encounter (Signed)
No need for cath unless he will proceed with surgery..  I have sent to Dr. Edilia Bo and I have not heard back from him either.  His symptoms were stable but he should keep appt with Dr. Darl Householder in April and of course if chest pain or increased dyspnea he should call.

## 2019-01-10 NOTE — Telephone Encounter (Signed)
Adrian Hester is aware Vernona Rieger has sent to Dr Edilia Bo but has not heard back, Adrian Hester has also called Dr Adele Dan office but not heard back. Adrian Hester is aware that if she/we get message from Dr Edilia Bo that surgery is necessary before April appt with Dr Purvis Sheffield that we will contact her to make a plan for cardiac cath.

## 2019-01-10 NOTE — Telephone Encounter (Signed)
I discussed Adrian Hester's recommendations with [name], she was comfortable with this plan, knows pt should keep scheduled appointment with Dr Purvis Sheffield in April, knows to call if change in pt's cardiac symptoms.

## 2019-01-11 ENCOUNTER — Telehealth: Payer: Self-pay | Admitting: *Deleted

## 2019-01-11 NOTE — Telephone Encounter (Signed)
Per Dr. Edilia Bo after reviewing this CTA done 3-18,.this patient will be seen in 6 months for followup on his AAA. Size of aneurysm was downgraded to 4.8cm and patient has heart failure (EF 26%). I called his daughter-in-law, [name], and she is in agreement to repeat his CTA in 6 months.

## 2019-01-15 NOTE — Telephone Encounter (Signed)
Copied from [facility]  01/11/19 phone note:  Per Dr. Edilia Bo after reviewing this CTA done 3-18,.this patient will be seen in 6 months for followup on his AAA. Size of aneurysm was downgraded to 4.8cm and patient has heart failure (EF 26%). I called his daughter-in-law, Adrian Hester, and she is in agreement to repeat his CTA in 6 months.

## 2019-01-17 ENCOUNTER — Encounter (HOSPITAL_COMMUNITY): Payer: Medicare HMO

## 2019-01-17 ENCOUNTER — Ambulatory Visit: Payer: Medicare HMO | Admitting: Vascular Surgery

## 2019-01-26 ENCOUNTER — Telehealth: Payer: Self-pay | Admitting: *Deleted

## 2019-01-26 NOTE — Telephone Encounter (Signed)
Contacted patient for review of chart for virtual visit (telephone) on 01/30/2019 with Dr. Purvis Sheffield at 9:40 am.    Medications reviewed.  No hospital visits.  Recent labs in epic.  Patient reminded to have vitals ready before the phone call begins.  He does not have a scale at home for weight.    The patient verbally consented for a telehealth phone visit with [facility] and understands that his/her insurance company will be billed for the encounter.

## 2019-01-30 ENCOUNTER — Telehealth: Payer: Self-pay | Admitting: Cardiovascular Disease

## 2019-01-30 ENCOUNTER — Telehealth: Payer: Medicare HMO | Admitting: Cardiovascular Disease

## 2019-01-30 NOTE — Telephone Encounter (Signed)
Patient came into office for his appointment - said that he was not told it was to be done by telephone.  He lives in [place] and said he could do later on if someone could call him and let him know if he can do today or has to be resheduled

## 2019-01-30 NOTE — Telephone Encounter (Signed)
Dr.Koneswaran made aware. He will call patient at home

## 2019-02-01 ENCOUNTER — Encounter: Payer: Self-pay | Admitting: Cardiovascular Disease

## 2019-02-01 ENCOUNTER — Telehealth (INDEPENDENT_AMBULATORY_CARE_PROVIDER_SITE_OTHER): Payer: Medicare HMO | Admitting: Cardiovascular Disease

## 2019-02-01 VITALS — BP 128/72 | HR 64 | Ht 72.0 in | Wt 242.0 lb

## 2019-02-01 DIAGNOSIS — R0609 Other forms of dyspnea: Secondary | ICD-10-CM

## 2019-02-01 DIAGNOSIS — I714 Abdominal aortic aneurysm, without rupture, unspecified: Secondary | ICD-10-CM

## 2019-02-01 DIAGNOSIS — R079 Chest pain, unspecified: Secondary | ICD-10-CM

## 2019-02-01 DIAGNOSIS — I10 Essential (primary) hypertension: Secondary | ICD-10-CM

## 2019-02-01 DIAGNOSIS — I25118 Atherosclerotic heart disease of native coronary artery with other forms of angina pectoris: Secondary | ICD-10-CM

## 2019-02-01 DIAGNOSIS — I350 Nonrheumatic aortic (valve) stenosis: Secondary | ICD-10-CM

## 2019-02-01 DIAGNOSIS — E785 Hyperlipidemia, unspecified: Secondary | ICD-10-CM

## 2019-02-01 DIAGNOSIS — R9439 Abnormal result of other cardiovascular function study: Secondary | ICD-10-CM | POA: Diagnosis not present
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
## 2019-02-01 NOTE — Patient Instructions (Signed)
Medication Instructions:  Continue all current medications.  Labwork: none  Testing/Procedures: none  Follow-Up: 3 months   Any Other Special Instructions Will Be Listed Below (If Applicable).  If you need a refill on your cardiac medications before your next appointment, please call your pharmacy.  

## 2019-02-17 IMAGING — NM NM MYOCAR MULTI W/SPECT W/WALL MOTION & EF
2 series · 12 of 12 positions shown · non-contrast
Comparison: none

[Series 1: rest · 6.51mm/px · 6 of 64 frames shown]
[frame 6/64]
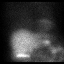
[frame 16/64]
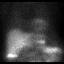
[frame 27/64]
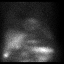
[frame 38/64]
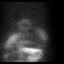
[frame 48/64]
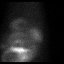
[frame 59/64]
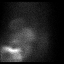

[Series 3: stress gated - perfusion · 6.51mm/px · 6 of 64 frames shown]
[frame 6/64]
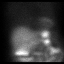
[frame 16/64]
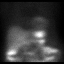
[frame 27/64]
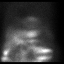
[frame 38/64]
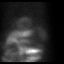
[frame 48/64]
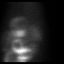
[frame 59/64]
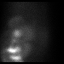

[12 of 12 positions shown; findings below may reference images not displayed]

Canned report from images found in remote index.

Refer to host system for actual result text.

## 2019-02-21 DIAGNOSIS — I2510 Atherosclerotic heart disease of native coronary artery without angina pectoris: Secondary | ICD-10-CM | POA: Diagnosis not present

## 2019-02-21 DIAGNOSIS — D72829 Elevated white blood cell count, unspecified: Secondary | ICD-10-CM | POA: Diagnosis not present

## 2019-02-21 DIAGNOSIS — I10 Essential (primary) hypertension: Secondary | ICD-10-CM | POA: Diagnosis not present

## 2019-02-21 DIAGNOSIS — E1143 Type 2 diabetes mellitus with diabetic autonomic (poly)neuropathy: Secondary | ICD-10-CM | POA: Diagnosis not present

## 2019-02-21 DIAGNOSIS — I714 Abdominal aortic aneurysm, without rupture: Secondary | ICD-10-CM | POA: Diagnosis not present

## 2019-02-21 DIAGNOSIS — I350 Nonrheumatic aortic (valve) stenosis: Secondary | ICD-10-CM | POA: Diagnosis not present

## 2019-02-21 DIAGNOSIS — Z6833 Body mass index (BMI) 33.0-33.9, adult: Secondary | ICD-10-CM | POA: Diagnosis not present

## 2019-02-21 DIAGNOSIS — I5032 Chronic diastolic (congestive) heart failure: Secondary | ICD-10-CM | POA: Diagnosis not present

## 2019-05-03 ENCOUNTER — Telehealth (INDEPENDENT_AMBULATORY_CARE_PROVIDER_SITE_OTHER): Payer: Medicare HMO | Admitting: Cardiovascular Disease

## 2019-05-03 ENCOUNTER — Encounter: Payer: Self-pay | Admitting: Cardiovascular Disease

## 2019-05-03 VITALS — BP 124/70 | HR 70 | Ht 72.0 in | Wt 243.0 lb

## 2019-05-03 DIAGNOSIS — I25118 Atherosclerotic heart disease of native coronary artery with other forms of angina pectoris: Secondary | ICD-10-CM

## 2019-05-03 DIAGNOSIS — R0609 Other forms of dyspnea: Secondary | ICD-10-CM

## 2019-05-03 DIAGNOSIS — I10 Essential (primary) hypertension: Secondary | ICD-10-CM

## 2019-05-03 DIAGNOSIS — I714 Abdominal aortic aneurysm, without rupture, unspecified: Secondary | ICD-10-CM

## 2019-05-03 DIAGNOSIS — E785 Hyperlipidemia, unspecified: Secondary | ICD-10-CM

## 2019-05-03 DIAGNOSIS — R9439 Abnormal result of other cardiovascular function study: Secondary | ICD-10-CM

## 2019-05-03 DIAGNOSIS — R079 Chest pain, unspecified: Secondary | ICD-10-CM

## 2019-05-03 DIAGNOSIS — I350 Nonrheumatic aortic (valve) stenosis: Secondary | ICD-10-CM

## 2019-05-03 NOTE — Patient Instructions (Signed)
Medication Instructions:  Continue all current medications.  Labwork: none  Testing/Procedures: Continue all current medications.  Follow-Up: 4 months   Any Other Special Instructions Will Be Listed Below (If Applicable).  If you need a refill on your cardiac medications before your next appointment, please call your pharmacy.  
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
## 2019-05-22 DIAGNOSIS — I2510 Atherosclerotic heart disease of native coronary artery without angina pectoris: Secondary | ICD-10-CM | POA: Diagnosis not present

## 2019-05-22 DIAGNOSIS — E7849 Other hyperlipidemia: Secondary | ICD-10-CM | POA: Diagnosis not present

## 2019-05-22 DIAGNOSIS — E1143 Type 2 diabetes mellitus with diabetic autonomic (poly)neuropathy: Secondary | ICD-10-CM | POA: Diagnosis not present

## 2019-05-22 DIAGNOSIS — E039 Hypothyroidism, unspecified: Secondary | ICD-10-CM | POA: Diagnosis not present

## 2019-05-22 DIAGNOSIS — I5032 Chronic diastolic (congestive) heart failure: Secondary | ICD-10-CM | POA: Diagnosis not present

## 2019-05-22 DIAGNOSIS — I10 Essential (primary) hypertension: Secondary | ICD-10-CM | POA: Diagnosis not present

## 2019-05-25 DIAGNOSIS — I714 Abdominal aortic aneurysm, without rupture: Secondary | ICD-10-CM | POA: Diagnosis not present

## 2019-05-25 DIAGNOSIS — I2510 Atherosclerotic heart disease of native coronary artery without angina pectoris: Secondary | ICD-10-CM | POA: Diagnosis not present

## 2019-05-25 DIAGNOSIS — E1143 Type 2 diabetes mellitus with diabetic autonomic (poly)neuropathy: Secondary | ICD-10-CM | POA: Diagnosis not present

## 2019-05-25 DIAGNOSIS — I5032 Chronic diastolic (congestive) heart failure: Secondary | ICD-10-CM | POA: Diagnosis not present

## 2019-05-25 DIAGNOSIS — I517 Cardiomegaly: Secondary | ICD-10-CM | POA: Diagnosis not present

## 2019-05-25 DIAGNOSIS — D72829 Elevated white blood cell count, unspecified: Secondary | ICD-10-CM | POA: Diagnosis not present

## 2019-05-25 DIAGNOSIS — I350 Nonrheumatic aortic (valve) stenosis: Secondary | ICD-10-CM | POA: Diagnosis not present

## 2019-05-25 DIAGNOSIS — I10 Essential (primary) hypertension: Secondary | ICD-10-CM | POA: Diagnosis not present

## 2019-06-01 ENCOUNTER — Other Ambulatory Visit: Payer: Self-pay | Admitting: Cardiovascular Disease

## 2019-08-05 ENCOUNTER — Emergency Department (HOSPITAL_COMMUNITY)
Admission: EM | Admit: 2019-08-05 | Discharge: 2019-08-05 | Discharge status: Against Medical Advice | Payer: Medicare HMO | Attending: Emergency Medicine | Admitting: Emergency Medicine

## 2019-08-05 ENCOUNTER — Encounter (HOSPITAL_COMMUNITY): Payer: Self-pay

## 2019-08-05 ENCOUNTER — Emergency Department (HOSPITAL_COMMUNITY): Payer: Medicare HMO

## 2019-08-05 ENCOUNTER — Other Ambulatory Visit: Payer: Self-pay

## 2019-08-05 DIAGNOSIS — E038 Other specified hypothyroidism: Secondary | ICD-10-CM | POA: Insufficient documentation

## 2019-08-05 DIAGNOSIS — Z7984 Long term (current) use of oral hypoglycemic drugs: Secondary | ICD-10-CM | POA: Diagnosis not present

## 2019-08-05 DIAGNOSIS — Z7982 Long term (current) use of aspirin: Secondary | ICD-10-CM | POA: Diagnosis not present

## 2019-08-05 DIAGNOSIS — R05 Cough: Secondary | ICD-10-CM

## 2019-08-05 DIAGNOSIS — R059 Cough, unspecified: Secondary | ICD-10-CM

## 2019-08-05 DIAGNOSIS — R509 Fever, unspecified: Secondary | ICD-10-CM | POA: Diagnosis not present

## 2019-08-05 DIAGNOSIS — Z79899 Other long term (current) drug therapy: Secondary | ICD-10-CM | POA: Insufficient documentation

## 2019-08-05 DIAGNOSIS — Z87891 Personal history of nicotine dependence: Secondary | ICD-10-CM | POA: Diagnosis not present

## 2019-08-05 DIAGNOSIS — E119 Type 2 diabetes mellitus without complications: Secondary | ICD-10-CM | POA: Insufficient documentation

## 2019-08-05 DIAGNOSIS — I10 Essential (primary) hypertension: Secondary | ICD-10-CM | POA: Diagnosis not present

## 2019-08-05 LAB — CBC WITH DIFFERENTIAL/PLATELET
Abs Immature Granulocytes: 0.04 10*3/uL (ref 0.00–0.07)
Basophils Absolute: 0.1 10*3/uL (ref 0.0–0.1)
Basophils Relative: 1 %
Eosinophils Absolute: 0.2 10*3/uL (ref 0.0–0.5)
Eosinophils Relative: 3 %
HCT: 39.6 % (ref 39.0–52.0)
Hemoglobin: 12.4 g/dL — ABNORMAL LOW (ref 13.0–17.0)
Immature Granulocytes: 1 %
Lymphocytes Relative: 10 %
Lymphs Abs: 0.8 10*3/uL (ref 0.7–4.0)
MCH: 29.3 pg (ref 26.0–34.0)
MCHC: 31.3 g/dL (ref 30.0–36.0)
MCV: 93.6 fL (ref 80.0–100.0)
Monocytes Absolute: 0.9 10*3/uL (ref 0.1–1.0)
Monocytes Relative: 11 %
Neutro Abs: 5.7 10*3/uL (ref 1.7–7.7)
Neutrophils Relative %: 74 %
Platelets: 125 10*3/uL — ABNORMAL LOW (ref 150–400)
RBC: 4.23 MIL/uL (ref 4.22–5.81)
RDW: 14.4 % (ref 11.5–15.5)
WBC: 7.7 10*3/uL (ref 4.0–10.5)
nRBC: 0 % (ref 0.0–0.2)

## 2019-08-05 LAB — COMPREHENSIVE METABOLIC PANEL
ALT: 23 U/L (ref 0–44)
AST: 20 U/L (ref 15–41)
Albumin: 4.1 g/dL (ref 3.5–5.0)
Alkaline Phosphatase: 64 U/L (ref 38–126)
Anion gap: 8 (ref 5–15)
BUN: 20 mg/dL (ref 8–23)
CO2: 22 mmol/L (ref 22–32)
Calcium: 9.3 mg/dL (ref 8.9–10.3)
Chloride: 108 mmol/L (ref 98–111)
Creatinine, Ser: 1.03 mg/dL (ref 0.61–1.24)
GFR calc Af Amer: >60 mL/min (ref >60)
GFR calc non Af Amer: >60 mL/min (ref >60)
Glucose, Bld: 155 mg/dL — ABNORMAL HIGH (ref 70–99)
Potassium: 4.2 mmol/L (ref 3.5–5.1)
Sodium: 138 mmol/L (ref 135–145)
Total Bilirubin: 0.4 mg/dL (ref 0.3–1.2)
Total Protein: 7 g/dL (ref 6.5–8.1)

## 2019-08-05 NOTE — ED Notes (Signed)
XR in room 
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
## 2019-08-05 NOTE — ED Triage Notes (Signed)
Pt presents to ED with complaints of fever, cough and chills started last night.

## 2019-08-05 NOTE — ED Notes (Signed)
PT LEFT WITHOUT FINISHING TX. PT DID NOT TELL STAFF HE WAS LEAVING. FAMILY CALLED AND NOTIFIED.

## 2019-08-05 NOTE — ED Notes (Signed)
Lab in room.

## 2019-08-10 LAB — CULTURE, BLOOD (ROUTINE X 2)
Culture: NO GROWTH
Culture: NO GROWTH
Special Requests: ADEQUATE
Special Requests: ADEQUATE

## 2019-08-13 DIAGNOSIS — J189 Pneumonia, unspecified organism: Secondary | ICD-10-CM | POA: Diagnosis not present

## 2019-08-13 DIAGNOSIS — R05 Cough: Secondary | ICD-10-CM | POA: Diagnosis not present

## 2019-08-18 DIAGNOSIS — U071 COVID-19: Secondary | ICD-10-CM | POA: Diagnosis not present

## 2019-08-18 DIAGNOSIS — E039 Hypothyroidism, unspecified: Secondary | ICD-10-CM | POA: Diagnosis not present

## 2019-08-18 DIAGNOSIS — I10 Essential (primary) hypertension: Secondary | ICD-10-CM | POA: Diagnosis not present

## 2019-08-18 DIAGNOSIS — R7989 Other specified abnormal findings of blood chemistry: Secondary | ICD-10-CM | POA: Diagnosis not present

## 2019-08-18 DIAGNOSIS — Z86711 Personal history of pulmonary embolism: Secondary | ICD-10-CM | POA: Diagnosis not present

## 2019-08-18 DIAGNOSIS — I959 Hypotension, unspecified: Secondary | ICD-10-CM | POA: Diagnosis not present

## 2019-08-18 DIAGNOSIS — J189 Pneumonia, unspecified organism: Secondary | ICD-10-CM | POA: Diagnosis not present

## 2019-08-18 DIAGNOSIS — R0902 Hypoxemia: Secondary | ICD-10-CM | POA: Diagnosis not present

## 2019-08-18 DIAGNOSIS — R000 Tachycardia, unspecified: Secondary | ICD-10-CM | POA: Diagnosis not present

## 2019-08-18 DIAGNOSIS — Z20828 Contact with and (suspected) exposure to other viral communicable diseases: Secondary | ICD-10-CM | POA: Diagnosis not present

## 2019-08-18 DIAGNOSIS — J1289 Other viral pneumonia: Secondary | ICD-10-CM | POA: Diagnosis not present

## 2019-08-18 DIAGNOSIS — E119 Type 2 diabetes mellitus without complications: Secondary | ICD-10-CM | POA: Diagnosis not present

## 2019-08-18 DIAGNOSIS — J9691 Respiratory failure, unspecified with hypoxia: Secondary | ICD-10-CM | POA: Diagnosis not present

## 2019-08-18 DIAGNOSIS — E785 Hyperlipidemia, unspecified: Secondary | ICD-10-CM | POA: Diagnosis not present

## 2019-08-18 DIAGNOSIS — J9601 Acute respiratory failure with hypoxia: Secondary | ICD-10-CM | POA: Diagnosis not present

## 2019-08-18 DIAGNOSIS — Z87891 Personal history of nicotine dependence: Secondary | ICD-10-CM | POA: Diagnosis not present

## 2019-08-18 DIAGNOSIS — R0602 Shortness of breath: Secondary | ICD-10-CM | POA: Diagnosis not present

## 2019-08-21 DIAGNOSIS — G9341 Metabolic encephalopathy: Secondary | ICD-10-CM | POA: Diagnosis not present

## 2019-08-21 DIAGNOSIS — J9601 Acute respiratory failure with hypoxia: Secondary | ICD-10-CM | POA: Diagnosis not present

## 2019-08-21 DIAGNOSIS — J80 Acute respiratory distress syndrome: Secondary | ICD-10-CM | POA: Diagnosis not present

## 2019-08-21 DIAGNOSIS — K311 Adult hypertrophic pyloric stenosis: Secondary | ICD-10-CM | POA: Diagnosis not present

## 2019-08-21 DIAGNOSIS — R112 Nausea with vomiting, unspecified: Secondary | ICD-10-CM | POA: Diagnosis not present

## 2019-08-21 DIAGNOSIS — R9431 Abnormal electrocardiogram [ECG] [EKG]: Secondary | ICD-10-CM | POA: Diagnosis not present

## 2019-08-21 DIAGNOSIS — Z9911 Dependence on respirator [ventilator] status: Secondary | ICD-10-CM | POA: Diagnosis not present

## 2019-08-21 DIAGNOSIS — E119 Type 2 diabetes mellitus without complications: Secondary | ICD-10-CM | POA: Diagnosis not present

## 2019-08-21 DIAGNOSIS — R140 Abdominal distension (gaseous): Secondary | ICD-10-CM | POA: Diagnosis not present

## 2019-08-21 DIAGNOSIS — A4189 Other specified sepsis: Secondary | ICD-10-CM | POA: Diagnosis not present

## 2019-08-21 DIAGNOSIS — Z9981 Dependence on supplemental oxygen: Secondary | ICD-10-CM | POA: Diagnosis not present

## 2019-08-21 DIAGNOSIS — K6389 Other specified diseases of intestine: Secondary | ICD-10-CM | POA: Diagnosis not present

## 2019-08-21 DIAGNOSIS — Z4682 Encounter for fitting and adjustment of non-vascular catheter: Secondary | ICD-10-CM | POA: Diagnosis not present

## 2019-08-21 DIAGNOSIS — R000 Tachycardia, unspecified: Secondary | ICD-10-CM | POA: Diagnosis not present

## 2019-08-21 DIAGNOSIS — Z66 Do not resuscitate: Secondary | ICD-10-CM | POA: Diagnosis not present

## 2019-08-21 DIAGNOSIS — Z452 Encounter for adjustment and management of vascular access device: Secondary | ICD-10-CM | POA: Diagnosis not present

## 2019-08-21 DIAGNOSIS — E871 Hypo-osmolality and hyponatremia: Secondary | ICD-10-CM | POA: Diagnosis not present

## 2019-08-21 DIAGNOSIS — J189 Pneumonia, unspecified organism: Secondary | ICD-10-CM | POA: Diagnosis not present

## 2019-08-21 DIAGNOSIS — J9600 Acute respiratory failure, unspecified whether with hypoxia or hypercapnia: Secondary | ICD-10-CM | POA: Diagnosis not present

## 2019-08-21 DIAGNOSIS — E039 Hypothyroidism, unspecified: Secondary | ICD-10-CM | POA: Diagnosis not present

## 2019-08-21 DIAGNOSIS — E785 Hyperlipidemia, unspecified: Secondary | ICD-10-CM | POA: Diagnosis not present

## 2019-08-21 DIAGNOSIS — I10 Essential (primary) hypertension: Secondary | ICD-10-CM | POA: Diagnosis not present

## 2019-08-21 DIAGNOSIS — I440 Atrioventricular block, first degree: Secondary | ICD-10-CM | POA: Diagnosis not present

## 2019-08-21 DIAGNOSIS — E870 Hyperosmolality and hypernatremia: Secondary | ICD-10-CM | POA: Diagnosis not present

## 2019-08-21 DIAGNOSIS — R0902 Hypoxemia: Secondary | ICD-10-CM | POA: Diagnosis not present

## 2019-08-21 DIAGNOSIS — N179 Acute kidney failure, unspecified: Secondary | ICD-10-CM | POA: Diagnosis not present

## 2019-08-21 DIAGNOSIS — Z7982 Long term (current) use of aspirin: Secondary | ICD-10-CM | POA: Diagnosis not present

## 2019-08-21 DIAGNOSIS — Z86711 Personal history of pulmonary embolism: Secondary | ICD-10-CM | POA: Diagnosis not present

## 2019-08-21 DIAGNOSIS — J1289 Other viral pneumonia: Secondary | ICD-10-CM | POA: Diagnosis not present

## 2019-08-21 DIAGNOSIS — D6869 Other thrombophilia: Secondary | ICD-10-CM | POA: Diagnosis not present

## 2019-08-21 DIAGNOSIS — R918 Other nonspecific abnormal finding of lung field: Secondary | ICD-10-CM | POA: Diagnosis not present

## 2019-08-21 DIAGNOSIS — J9691 Respiratory failure, unspecified with hypoxia: Secondary | ICD-10-CM | POA: Diagnosis not present

## 2019-08-21 DIAGNOSIS — U071 COVID-19: Secondary | ICD-10-CM | POA: Diagnosis not present

## 2019-08-21 DIAGNOSIS — I517 Cardiomegaly: Secondary | ICD-10-CM | POA: Diagnosis not present

## 2019-08-31 ENCOUNTER — Ambulatory Visit: Payer: Medicare HMO | Admitting: Cardiovascular Disease

## 2019-09-18 DEATH — deceased

## 2020-02-11 IMAGING — CR DG CHEST 1V PORT
1 series · 1 of 1 positions shown · non-contrast
Comparison: Prior radiograph from 11/30/2008.
COMPARISON: Prior radiograph from 11/30/2008.

CLINICAL DATA: Initial evaluation for acute fever, cough, chills.

EXAM:
PORTABLE CHEST 1 VIEW

[portable]
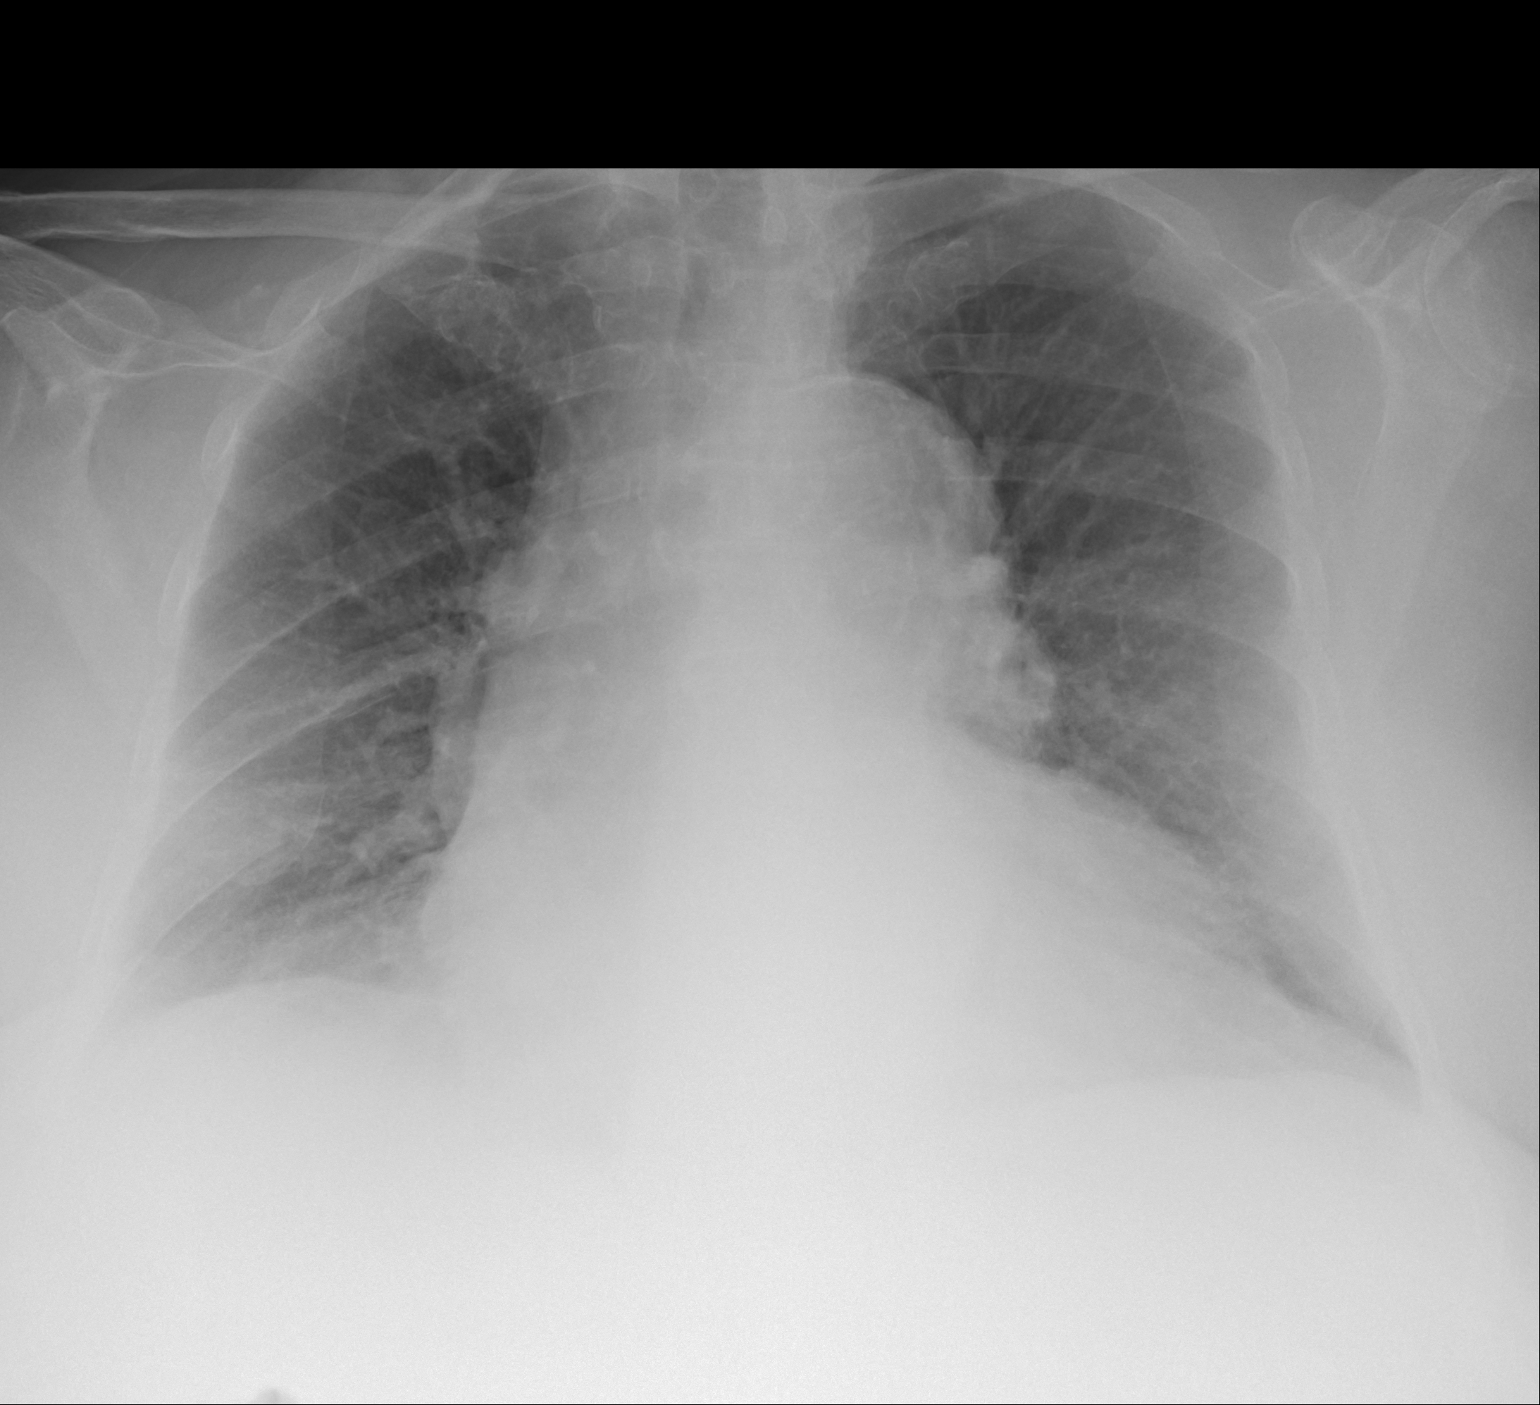

[1 of 1 positions shown; findings below may reference images not displayed]

FINDINGS: Mild cardiomegaly. There is prominence of the aortic arch, which
could reflect underlying aneurysmal dilatation of the intrathoracic
aorta. Atherosclerosis noted. Mediastinal silhouette otherwise
within normal limits.

Lungs mildly hypoinflated. Perihilar vascular congestion without
overt pulmonary edema. No consolidative airspace disease. No visible
pleural effusion. No pneumothorax.

No acute osseous finding.
FINDINGS: Mild cardiomegaly. There is prominence of the aortic arch, which
could reflect underlying aneurysmal dilatation of the intrathoracic
aorta. Atherosclerosis noted. Mediastinal silhouette otherwise
within normal limits.

Lungs mildly hypoinflated. Perihilar vascular congestion without
overt pulmonary edema. No consolidative airspace disease. No visible
pleural effusion. No pneumothorax.

No acute osseous finding.
IMPRESSION: 1. Cardiomegaly with mild perihilar vascular congestion without
overt pulmonary edema.
2. No focal infiltrates consolidative airspace disease.
3. Prominence of the aortic arch, which could reflect underlying
aneurysm. Further assessment with dedicated cross-sectional imaging
suggested for further evaluation.
